# Patient Record
Sex: Female | Born: 1970
Health system: Southern US, Community
[De-identification: ages and names within clinical notes are randomized; demographics above are authoritative.]

## PROBLEM LIST (undated history)

## (undated) DIAGNOSIS — Z87442 Personal history of urinary calculi: Secondary | ICD-10-CM

## (undated) DIAGNOSIS — M545 Low back pain, unspecified: Secondary | ICD-10-CM

## (undated) DIAGNOSIS — J329 Chronic sinusitis, unspecified: Secondary | ICD-10-CM

## (undated) DIAGNOSIS — A64 Unspecified sexually transmitted disease: Secondary | ICD-10-CM

## (undated) DIAGNOSIS — G8929 Other chronic pain: Secondary | ICD-10-CM

## (undated) HISTORY — DX: Unspecified sexually transmitted disease: A64

## (undated) HISTORY — DX: Low back pain, unspecified: M54.50

## (undated) HISTORY — DX: Chronic sinusitis, unspecified: J32.9

## (undated) HISTORY — DX: Low back pain, unspecified: G89.29

## (undated) HISTORY — DX: Personal history of urinary calculi: Z87.442

## (undated) HISTORY — DX: Low back pain: M54.5

---

## 2003-08-04 DIAGNOSIS — M5137 Other intervertebral disc degeneration, lumbosacral region: Secondary | ICD-10-CM | POA: Insufficient documentation

## 2003-08-04 DIAGNOSIS — M51379 Other intervertebral disc degeneration, lumbosacral region without mention of lumbar back pain or lower extremity pain: Secondary | ICD-10-CM | POA: Insufficient documentation

## 2014-12-07 ENCOUNTER — Ambulatory Visit (INDEPENDENT_AMBULATORY_CARE_PROVIDER_SITE_OTHER): Payer: 59 | Admitting: Obstetrics and Gynecology

## 2014-12-07 ENCOUNTER — Encounter: Payer: Self-pay | Admitting: Obstetrics and Gynecology

## 2014-12-07 VITALS — BP 112/68 | HR 64 | Resp 14 | Ht 65.5 in | Wt 162.0 lb

## 2014-12-07 DIAGNOSIS — Z113 Encounter for screening for infections with a predominantly sexual mode of transmission: Secondary | ICD-10-CM

## 2014-12-07 DIAGNOSIS — Z23 Encounter for immunization: Secondary | ICD-10-CM

## 2014-12-07 DIAGNOSIS — B3731 Acute candidiasis of vulva and vagina: Secondary | ICD-10-CM

## 2014-12-07 DIAGNOSIS — Z0000 Encounter for general adult medical examination without abnormal findings: Secondary | ICD-10-CM | POA: Diagnosis not present

## 2014-12-07 DIAGNOSIS — Z01419 Encounter for gynecological examination (general) (routine) without abnormal findings: Secondary | ICD-10-CM | POA: Diagnosis not present

## 2014-12-07 DIAGNOSIS — Z124 Encounter for screening for malignant neoplasm of cervix: Secondary | ICD-10-CM

## 2014-12-07 DIAGNOSIS — B373 Candidiasis of vulva and vagina: Secondary | ICD-10-CM | POA: Diagnosis not present

## 2014-12-07 LAB — POCT URINE PREGNANCY: PREG TEST UR: NEGATIVE

## 2014-12-07 MED ORDER — FLUCONAZOLE 150 MG PO TABS: 150.0000 mg | ORAL_TABLET | Freq: Once | ORAL | Status: DC

## 2014-12-07 MED ORDER — VALACYCLOVIR HCL 500 MG PO TABS: ORAL_TABLET | ORAL | Status: DC

## 2014-12-07 MED ORDER — MISOPROSTOL 200 MCG PO TABS: ORAL_TABLET | ORAL | Status: DC

## 2014-12-07 NOTE — Progress Notes (Signed)
Patient ID: Lauren Randolph, female   DOB: 08/07/1970, 44 y.o.   MRN: [id number] 44 y.o. G2P0020 SingleCaucasianF here for annual exam.  Normal cycles, tolerable cramps. Sexually active using w/d for contraception. New partner x 7-8 weeks, no dyspareunia. She has a h/o breast cysts. She can palpate a cyst in her right breast, no change, just gets tender with her cycle.  H/O hsv, rare outbreaks every 1.5 years. She had normal labs with her primary MD. She will do annual testing with work.  Period Cycle (Days): 26 Period Duration (Days): 5 Period Pattern: Regular Menstrual Flow:  (heavy x2 days then light) Menstrual Control: Tampon (recently using soft cups) Menstrual Control Change Freq (Hours): on heaviest day changes soft cup every 9 hrs Dysmenorrhea: (!) Moderate (cramps on first 1-2 days) Dysmenorrhea Symptoms: Cramping  Patient's last menstrual period was 11/18/2014.          Sexually active: Yes.    The current method of family planning is withdrawal.    Exercising: Yes.    orange theorgy classes, Barre,yoga and walking. Smoker:  no  Health Maintenance: Pap:  11-23-13  Normal per patient--unsure if had HPV testing. History of abnormal Pap:  no MMG:  11-24-13 [place] in [place].  Has had ultrasounds due to benign cysts on breasts. Colonoscopy:  n/a BMD:   n/a TDaP:  Unsure, greater than 10 years? Gardasil: n/a   reports that she has never smoked. She has never used smokeless tobacco. She reports that she drinks about 0.6 oz of alcohol per week. She reports that she does not use illicit drugs.She  moved here at the end of last year. She works from home, family is 1 hour away.   Past Medical History  Diagnosis Date  . History of kidney stones     at age 89  . STD (sexually transmitted disease)     hx of HSVII since age 44    History reviewed. No pertinent past surgical history.  No current outpatient prescriptions on file.   No current facility-administered medications  for this visit.    Family History  Problem Relation Age of Onset  . Heart disease Father   . Other Father     Dec MVA due to poss heart attack at age 44  . Diabetes Father     AODM  . Hypertension Father   . Heart disease Sister   . Heart attack Sister [id number]  . Hypertension Mother   . Stroke Maternal Grandmother   Sister was overweight, terrible diet and smoker  Review of Systems  Constitutional: Negative.   HENT: Negative.   Eyes: Negative.   Respiratory: Negative.   Cardiovascular: Negative.   Gastrointestinal: Negative.   Endocrine: Negative.   Genitourinary: Negative.   Musculoskeletal: Negative.   Skin: Negative.   Allergic/Immunologic: Negative.   Neurological: Negative.   Psychiatric/Behavioral: Negative.     Exam:   BP 112/68 mmHg  Pulse 64  Resp 14  Ht 5' 5.5" (1.664 m)  Wt 162 lb (73.483 kg)  BMI 26.54 kg/m2  LMP 11/18/2014  Weight change: @WEIGHTCHANGE @ Height:   Height: 5' 5.5" (166.4 cm)  Ht Readings from Last 3 Encounters:  12/07/14 5' 5.5" (1.664 m)    General appearance: alert, cooperative and appears stated age Head: Normocephalic, without obvious abnormality, atraumatic Neck: no adenopathy, supple, symmetrical, trachea midline and thyroid normal to inspection and palpation Lungs: clear to auscultation bilaterally Breasts: 2 cm cystic lump palpated in the upper outer right breast,  per patient confirmed cyst, no change. No other lumps, no dimpling.  Heart: regular rate and rhythm Abdomen: soft, non-tender; bowel sounds normal; no masses,  no organomegaly Extremities: extremities normal, atraumatic, no cyanosis or edema Skin: Skin color, texture, turgor normal. No rashes or lesions Lymph nodes: Cervical, supraclavicular, and axillary nodes normal. No abnormal inguinal nodes palpated Neurologic: Grossly normal   Pelvic: External genitalia:  no lesions              Urethra:  normal appearing urethra with no masses, tenderness or lesions               Bartholins and Skenes: normal                 Vagina: normal appearing vagina with normal color. Thick clumpy white d/c noted, cw yeast (on questioning she c/o mild itching this morning)              Cervix: no lesions               Bimanual Exam:  Uterus:  normal size, contour, position, consistency, mobility, non-tender              Adnexa: no mass, fullness, tenderness               Rectovaginal: Confirms               Anus:  normal sphincter tone, no lesions  Chaperone was present for exam.  Wet prep: no clue, no trich KOH: +yeast PH: 4  A:  Well Woman with normal exam   Contraception  HSV, rare outbreaks, has a new partner  Yeast vaginitis  P:   Will send in a script for valtrex for prn use, she will call if she wants to start suppression   Pap with hpv testing  Will return for paragaurd IUD when she is on her cycle  Will pretreat with cytotec  D[place]

## 2014-12-08 LAB — HEPATITIS C ANTIBODY: HCV AB: NEGATIVE

## 2014-12-08 LAB — STD PANEL
HEP B S AG: NEGATIVE
HIV 1&2 Ab, 4th Generation: NONREACTIVE

## 2014-12-11 ENCOUNTER — Other Ambulatory Visit: Payer: Self-pay

## 2014-12-11 DIAGNOSIS — Z1231 Encounter for screening mammogram for malignant neoplasm of breast: Secondary | ICD-10-CM

## 2014-12-11 LAB — IPS PAP TEST WITH HPV

## 2014-12-11 LAB — IPS N GONORRHOEA AND CHLAMYDIA BY PCR

## 2014-12-15 ENCOUNTER — Telehealth: Payer: Self-pay | Admitting: Obstetrics and Gynecology

## 2014-12-15 DIAGNOSIS — Z30430 Encounter for insertion of intrauterine contraceptive device: Secondary | ICD-10-CM

## 2014-12-15 NOTE — Telephone Encounter (Signed)
Patient started cycle today and would like to schedule iud insertion for Monday possibly.

## 2014-12-15 NOTE — Telephone Encounter (Signed)
Contacted patient, she started her cycle today.  Scheduled paragard IUD insertion for 12/18/14 at 1430. Pre-procedure instructions given. Motrin 800 mg PO one hour before appointment with food and fluids. Verbalizes understanding instructions regarding use of Cytotec.  Order placed, patient has checked her own insurance for coverage and is ready to proceed.  Routing to provider for final review. Patient agreeable to disposition. Will close encounter.  cc Lilyan GilfordBecky Frahm for insurance pre-certification and patient contact.

## 2014-12-18 ENCOUNTER — Ambulatory Visit (INDEPENDENT_AMBULATORY_CARE_PROVIDER_SITE_OTHER): Payer: 59 | Admitting: Obstetrics and Gynecology

## 2014-12-18 ENCOUNTER — Encounter: Payer: Self-pay | Admitting: Obstetrics and Gynecology

## 2014-12-18 VITALS — BP 100/68 | HR 58 | Resp 14 | Wt 166.0 lb

## 2014-12-18 DIAGNOSIS — Z30430 Encounter for insertion of intrauterine contraceptive device: Secondary | ICD-10-CM | POA: Diagnosis not present

## 2014-12-18 DIAGNOSIS — Z01812 Encounter for preprocedural laboratory examination: Secondary | ICD-10-CM | POA: Diagnosis not present

## 2014-12-18 LAB — POCT URINE PREGNANCY: Preg Test, Ur: NEGATIVE

## 2014-12-18 NOTE — Progress Notes (Signed)
Patient ID: Lauren Randolph, female   DOB: 25-Jun-1970, 44 y.o.   MRN: [id number] GYNECOLOGY  VISIT   HPI: 44 y.o.   Single  Caucasian  female   G2P0020 with Patient's last menstrual period was 12/15/2014.   here for  Paragard IUD insertion. Recent negative STD testing  GYNECOLOGIC HISTORY: Patient's last menstrual period was 12/15/2014. Contraception:None Menopausal hormone therapy: N/A        OB History    Gravida Para Term Preterm AB TAB SAB Ectopic Multiple Living           There are no active problems to display for this patient.   Past Medical History  Diagnosis Date  . History of kidney stones     at age 23  . STD (sexually transmitted disease)     hx of HSVII since age 64    History reviewed. No pertinent past surgical history.  Current Outpatient Prescriptions  Medication Sig Dispense Refill  . misoprostol (CYTOTEC) 200 MCG tablet Place 2 tablets in the vagina 6-12 hours prior to IUD insertion 1 tablet 0  . valACYclovir (VALTREX) 500 MG tablet 1 tab po bid x 3 days prn 30 tablet 1   No current facility-administered medications for this visit.     ALLERGIES: Review of patient's allergies indicates no known allergies.  Family History  Problem Relation Age of Onset  . Heart disease Father   . Other Father     Dec MVA due to poss heart attack at age 18  . Diabetes Father     AODM  . Hypertension Father   . Heart disease Sister   . Heart attack Sister 21  . Hypertension Mother   . Stroke Maternal Grandmother     Social History   Social History  . Marital Status: Single    Spouse Name: N/A  . Number of Children: N/A  . Years of Education: N/A   Occupational History  . Not on file.   Social History Main Topics  . Smoking status: Never Smoker   . Smokeless tobacco: Never Used  . Alcohol Use: 0.6 oz/week    1 Standard drinks or equivalent per week  . Drug Use: No  . Sexual Activity: Yes    Birth Control/ Protection: Other-see  comments     Comment: withdrawal   Other Topics Concern  . Not on file   Social History Narrative    Review of Systems  All other systems reviewed and are negative.   PHYSICAL EXAMINATION:    BP 100/68 mmHg  Pulse 58  Resp 14  Wt 166 lb (75.297 kg)  LMP 12/15/2014    General appearance: alert, cooperative and appears stated age  Pelvic: External genitalia:  no lesions              Urethra:  normal appearing urethra with no masses, tenderness or lesions              Bartholins and Skenes: normal                 Vagina: normal appearing vagina with normal color and discharge, no lesions              Cervix: no lesions              UPT negative  The risks of the Paragard IUD were reviewed with the patient, including infection, abnormal bleeding and uterine perfortion. Consent was signed.  A speculum was placed in the vagina, the cervix was cleansed with betadine. A tenaculum was placed on the cervix, the uterus sounded to 7-8cm. The cervix was dilated to a #5 hagar dilator  The Paragard IUD was inserted without difficulty. The string were cut to 4 cm. The tenaculum was removed. Slight oozing from the tenaculum site was stopped with pressure.   The patient tolerated the procedure well.     Chaperone was present for exam.  ASSESSMENT Paragard IUD insertion    PLAN F/U in 1 month   An After Visit Summary was printed and given to the patient.

## 2014-12-18 NOTE — Patient Instructions (Signed)

## 2014-12-27 ENCOUNTER — Ambulatory Visit
Admission: RE | Admit: 2014-12-27 | Discharge: 2014-12-27 | Disposition: A | Discharge status: Home / Self Care | Payer: 59 | Source: Ambulatory Visit

## 2014-12-27 DIAGNOSIS — Z1231 Encounter for screening mammogram for malignant neoplasm of breast: Secondary | ICD-10-CM

## 2015-01-18 ENCOUNTER — Ambulatory Visit (INDEPENDENT_AMBULATORY_CARE_PROVIDER_SITE_OTHER): Payer: 59 | Admitting: Obstetrics and Gynecology

## 2015-01-18 ENCOUNTER — Encounter: Payer: Self-pay | Admitting: Obstetrics and Gynecology

## 2015-01-18 ENCOUNTER — Ambulatory Visit: Payer: 59 | Admitting: Obstetrics and Gynecology

## 2015-01-18 VITALS — BP 108/68 | HR 70 | Resp 18 | Ht 65.5 in | Wt 168.0 lb

## 2015-01-18 DIAGNOSIS — Z30431 Encounter for routine checking of intrauterine contraceptive device: Secondary | ICD-10-CM

## 2015-01-18 NOTE — Progress Notes (Signed)
GYNECOLOGY  VISIT   HPI: 44 y.o.   Single  Caucasian  female   G2P0020 with Patient's last menstrual period was 01/15/2015 (exact date).   here for   IUD check. She had a ParaGard IUD placed last month. She is on her cycle now, started with intermittent spotting, waves of cramping. On Monday the flow started, no significant pain with the blood flow. Bleeding is slightly heavier, not bad. She has saturated a regular tampon in 3 hours. Sexually active since insertion, slightly uncomfortable initially, now fine.   GYNECOLOGIC HISTORY: Patient's last menstrual period was 01/15/2015 (exact date). Contraception:IUD Menopausal hormone therapy: None        OB History    Gravida Para Term Preterm AB TAB SAB Ectopic Multiple Living   2 0 0 0 2 2 0 0 0 0          There are no active problems to display for this patient.   Past Medical History  Diagnosis Date  . History of kidney stones     at age 89  . STD (sexually transmitted disease)     hx of HSVII since age 44  . Sinusitis     History reviewed. No pertinent past surgical history.  Current Outpatient Prescriptions  Medication Sig Dispense Refill  . fluconazole (DIFLUCAN) 150 MG tablet Take one tablet now.  If symptoms have not improved in 3 days take additional tablet    . fluticasone (FLONASE) 50 MCG/ACT nasal spray Place 1 spray into the nose.    . predniSONE (DELTASONE) 20 MG tablet Take 40 mg by mouth.    . valACYclovir (VALTREX) 500 MG tablet 1 tab po bid x 3 days prn 30 tablet 1  . misoprostol (CYTOTEC) 200 MCG tablet Place 2 tablets in the vagina 6-12 hours prior to IUD insertion (Patient not taking: Reported on 01/18/2015) 1 tablet 0   No current facility-administered medications for this visit.     ALLERGIES: Review of patient's allergies indicates no known allergies.  Family History  Problem Relation Age of Onset  . Heart disease Father   . Other Father     Dec MVA due to poss heart attack at age 44  . Diabetes  Father     AODM  . Hypertension Father   . Heart disease Sister   . Heart attack Sister [id number]  . Hypertension Mother   . Stroke Maternal Grandmother     Social History   Social History  . Marital Status: Single    Spouse Name: N/A  . Number of Children: N/A  . Years of Education: N/A   Occupational History  . Not on file.   Social History Main Topics  . Smoking status: Never Smoker   . Smokeless tobacco: Never Used  . Alcohol Use: 0.6 oz/week    1 Standard drinks or equivalent per week  . Drug Use: No  . Sexual Activity: Yes    Birth Control/ Protection: Other-see comments     Comment: withdrawal   Other Topics Concern  . Not on file   Social History Narrative    ROS  PHYSICAL EXAMINATION:    BP 108/68 mmHg  Pulse 70  Resp 18  Ht 5' 5.5" (1.664 m)  Wt 168 lb (76.204 kg)  BMI 27.52 kg/m2  LMP 01/15/2015 (Exact Date)    General appearance: alert, cooperative and appears stated age  Pelvic: External genitalia:  no lesions  Urethra:  normal appearing urethra with no masses, tenderness or lesions              Bartholins and Skenes: normal                 Vagina: normal appearing vagina with normal color and discharge, no lesions              Cervix: no lesions and IUD string 3-4 cm              Bimanual Exam:  Uterus:  normal size, contour, position, consistency, mobility, non-tender              Adnexa: no mass, fullness, tenderness                 ASSESSMENT IUD check, overall doing well with the Paragard, some increased premenstrual cramping    PLAN She will call if the cramping doesn't continue to improve   An After Visit Summary was printed and given to the patient.

## 2015-11-30 ENCOUNTER — Telehealth: Payer: Self-pay | Admitting: *Deleted

## 2015-11-30 ENCOUNTER — Encounter: Payer: Self-pay | Admitting: *Deleted

## 2015-11-30 NOTE — Telephone Encounter (Signed)
Unable to reach patient at time of Pre-Visit Call.  Left message for patient to return call when available.    

## 2015-11-30 NOTE — Telephone Encounter (Signed)
Pre-Visit Call completed with patient and chart updated.   Pre-Visit Info documented in Specialty Comments under SnapShot.    

## 2015-12-03 ENCOUNTER — Ambulatory Visit (INDEPENDENT_AMBULATORY_CARE_PROVIDER_SITE_OTHER): Payer: 59 | Admitting: Family Medicine

## 2015-12-03 ENCOUNTER — Encounter: Payer: Self-pay | Admitting: Family Medicine

## 2015-12-03 VITALS — BP 118/79 | HR 69 | Temp 99.0°F | Ht 65.5 in | Wt 175.0 lb

## 2015-12-03 DIAGNOSIS — B351 Tinea unguium: Secondary | ICD-10-CM | POA: Diagnosis not present

## 2015-12-03 DIAGNOSIS — Z13220 Encounter for screening for lipoid disorders: Secondary | ICD-10-CM

## 2015-12-03 DIAGNOSIS — Z131 Encounter for screening for diabetes mellitus: Secondary | ICD-10-CM | POA: Diagnosis not present

## 2015-12-03 DIAGNOSIS — Z130 Encounter for screening for diseases of the blood and blood-forming organs and certain disorders involving the immune mechanism: Secondary | ICD-10-CM | POA: Diagnosis not present

## 2015-12-03 DIAGNOSIS — Z23 Encounter for immunization: Secondary | ICD-10-CM | POA: Diagnosis not present

## 2015-12-03 DIAGNOSIS — Z1329 Encounter for screening for other suspected endocrine disorder: Secondary | ICD-10-CM | POA: Diagnosis not present

## 2015-12-03 DIAGNOSIS — R911 Solitary pulmonary nodule: Secondary | ICD-10-CM

## 2015-12-03 LAB — COMPREHENSIVE METABOLIC PANEL
ALT: 32 U/L (ref 0–35)
AST: 30 U/L (ref 0–37)
Albumin: 4.5 g/dL (ref 3.5–5.2)
Alkaline Phosphatase: 81 U/L (ref 39–117)
BILIRUBIN TOTAL: 1.1 mg/dL (ref 0.2–1.2)
BUN: 13 mg/dL (ref 6–23)
CALCIUM: 9.8 mg/dL (ref 8.4–10.5)
CHLORIDE: 103 meq/L (ref 96–112)
CO2: 31 meq/L (ref 19–32)
CREATININE: 0.84 mg/dL (ref 0.40–1.20)
GFR: 77.94 mL/min (ref >60.00)
Glucose, Bld: 94 mg/dL (ref 70–99)
Potassium: 4.4 mEq/L (ref 3.5–5.1)
SODIUM: 139 meq/L (ref 135–145)
Total Protein: 7.3 g/dL (ref 6.0–8.3)

## 2015-12-03 LAB — TSH: TSH: 1.51 u[IU]/mL (ref 0.35–4.50)

## 2015-12-03 LAB — LIPID PANEL
CHOLESTEROL: 171 mg/dL (ref 0–200)
HDL: 67.7 mg/dL (ref >39.00)
LDL Cholesterol: 86 mg/dL (ref 0–99)
NonHDL: 103.29
TRIGLYCERIDES: 86 mg/dL (ref 0.0–149.0)
Total CHOL/HDL Ratio: 3
VLDL: 17.2 mg/dL (ref 0.0–40.0)

## 2015-12-03 LAB — CBC
HCT: 40.2 % (ref 36.0–46.0)
Hemoglobin: 13.9 g/dL (ref 12.0–15.0)
MCHC: 34.5 g/dL (ref 30.0–36.0)
MCV: 91.8 fl (ref 78.0–100.0)
PLATELETS: 185 10*3/uL (ref 150.0–400.0)
RBC: 4.37 Mil/uL (ref 3.87–5.11)
RDW: 12.3 % (ref 11.5–15.5)
WBC: 5.7 10*3/uL (ref 4.0–10.5)

## 2015-12-03 LAB — HEMOGLOBIN A1C: HEMOGLOBIN A1C: 4.9 % (ref 4.6–6.5)

## 2015-12-03 MED ORDER — TERBINAFINE HCL 250 MG PO TABS: 250.0000 mg | ORAL_TABLET | Freq: Every day | ORAL | 2 refills | Status: DC

## 2015-12-03 MED FILL — TERBINAFINE HCL 250 MG TAB: 250 | 30 days supply | Qty: 30 | Fill #0

## 2015-12-03 NOTE — Patient Instructions (Signed)
It was very nice to see you today- take care and please let me know if you do not receive word about your CT chest soon We will check you labs today, and will start you on terbinafine (lamisil) 250 daily for 12 weeks for your toenails I will be in touch with your labs and chest x-ray report

## 2015-12-03 NOTE — Progress Notes (Signed)
[facility] [address], [address] [place], [place] [address] [phone] [phone]  Date:  12/03/2015   Name:  Lauren Randolph   DOB:  10/16/1970   MRN:  [id number]  PCP:  Abbe AmsterdamOPLAND,Lynnea Vandervoort, MD    Chief Complaint: Establish Care   History of Present Illness:  Lauren Randolph is a 45 y.o. very pleasant female patient who presents with the following:  Here today as a new patient- [place] She would like her flu shot today She has never been a smoker, light alcohol use.   She has a paraguard IUD in place, no concern about pregnancy.  IUD placed last year  She is originally from [place], moved here a couple of years ago to be closer to her family.  She works with [employer], she is able to work from home.    She did have renal stones as a small child- she has not had issues with this since.  She had calcium stones; they are unsure why she developed these.   She is married, no children. She is recently married- this past september In her free time she likes to exercise- she recently joined a new gym.  She enjoys doing classes and especially yoga.  She does tend to have low back pain but exercise helps her; she does have degenerative disc disease at L4/5 She has gained a little bit of weight which has exacerbated her back pain. She plans to work on losing this once she gets settled- she and her husband have recently moved into a new home  She did some decompression therapy through a non- surgical spine center that did help her some.   Her pap, tdap are UTD.   She has noted a little nasal congestion over the last few days but an OTC allergy med- claritin- helps her so she thinks she likely has mild allergies  She is fasting today except for coffee.   There are no active problems to display for this patient.   Past Medical History:  Diagnosis Date  . Chronic low back pain   . History of kidney stones    at age 89  . Sinusitis   . STD  (sexually transmitted disease)    hx of HSVII since age 45    History reviewed. No pertinent surgical history.  Social History  Substance Use Topics  . Smoking status: Never Smoker  . Smokeless tobacco: Never Used  . Alcohol use 1.2 oz/week    1 Standard drinks or equivalent, 1 Glasses of wine per week    Family History  Problem Relation Age of Onset  . Heart disease Father   . Other Father     Dec MVA due to poss heart attack at age 45  . Diabetes Father     AODM  . Hypertension Father   . Heart attack Sister [id number]  . Hypertension Mother   . Heart disease Sister   . Stroke Maternal Grandmother     No Known Allergies  Medication list has been reviewed and updated.  Current Outpatient Prescriptions on File Prior to Visit  Medication Sig Dispense Refill  . loratadine (CLARITIN) 10 MG tablet Take 10 mg by mouth daily as needed for allergies.    . valACYclovir (VALTREX) 500 MG tablet 1 tab po bid x 3 days prn 30 tablet 1   No current facility-administered medications on file prior to visit.     Review of  Systems:  As per HPI- otherwise negative. She has been exposed to 2nd hand smoke- as a child her parents smoked in the home Her father had an MI at age 45, and her sister was 45 yo when she had her MI- however they were all heavy smokers.  She herself does not have any exertional CP She did have "heart savers" testing done in 2015; unfortunately she had screening full body CT including her head, chest, abd and pelvis!  This was all normal except for a small lung nodule- they recommended a follow-up scan which has not been done yet.   No fever or chills She has noted some thickening of her toenails, esp the left great toe following some trauma a few weeks ago   Physical Examination: Vitals:   12/03/15 0856  BP: 118/79  Pulse: 69  Temp: 99 F (37.2 C)   Vitals:   12/03/15 0856  Weight: 175 lb (79.4 kg)  Height: 5' 5.5" (1.664 m)   Body mass index is 28.68  kg/m. Ideal Body Weight: Weight in (lb) to have BMI = 25: 152.2  GEN: WDWN, NAD, Non-toxic, A & O x 3, mild overweight, looks well HEENT: Atraumatic, Normocephalic. Neck supple. No masses, No LAD. Bilateral TM wnl, oropharynx normal.  PEERL,EOMI.   Ears and Nose: No external deformity. CV: RRR, No M/G/R. No JVD. No thrill. No extra heart sounds. PULM: CTA B, no wheezes, crackles, rhonchi. No retractions. No resp. distress. No accessory muscle use. ABD: S, NT, ND. No rebound. No HSM. EXTR: No c/c/e NEURO Normal gait.  PSYCH: Normally interactive. Conversant. Not depressed or anxious appearing.  Calm demeanor.  The great, 2nd and pinky toes on the left show thickening and some lifting of the nail likely due to a fungal infection   Assessment and Plan: Onychomycosis - Plan: CBC, Comprehensive metabolic panel, terbinafine (LAMISIL) 250 MG tablet  Screening for hyperlipidemia - Plan: Lipid panel  Screening for deficiency anemia - Plan: CBC  Screening for diabetes mellitus - Plan: Hemoglobin A1C  Screening for thyroid disorder - Plan: TSH  Lung nodule - Plan: CT Chest Wo Contrast  Her today as a new patinet  Screening labs as above Discussed her "screening CT and coronary calcium testing."  She now understands that such testing is not recommended and that it may expose her to harmful radiation- she does not plan to do this sort of screening again. unfortunately they did see a lung nodule and recommended follow-up.  Will do a follow-up CT for her, as she is now 2 years out we hope this will be her last screening.  The report is not clear about the exact location of the nodule but she does have the original study on a disc which she will bring to her CT appt Discussed her toenails- likely onychomycosis.  She declines culture but would like to go ahead and treat for 12 weeks which is reasonable.  Will get baseline labs for her today to ensure lamisil is ok for her to use Will plan further  follow- up pending labs.   Signed Abbe AmsterdamJessica Jyaire Koudelka, MD

## 2015-12-03 NOTE — Progress Notes (Signed)
Pre visit review using our clinic review tool, if applicable. No additional management support is needed unless otherwise documented below in the visit note. 

## 2015-12-04 ENCOUNTER — Ambulatory Visit (HOSPITAL_BASED_OUTPATIENT_CLINIC_OR_DEPARTMENT_OTHER)
Admission: RE | Admit: 2015-12-04 | Discharge: 2015-12-04 | Disposition: A | Discharge status: Home / Self Care | Payer: 59 | Source: Ambulatory Visit | Attending: Family Medicine | Admitting: Family Medicine

## 2015-12-04 ENCOUNTER — Encounter: Payer: Self-pay | Admitting: Family Medicine

## 2015-12-04 DIAGNOSIS — R918 Other nonspecific abnormal finding of lung field: Secondary | ICD-10-CM | POA: Diagnosis not present

## 2015-12-04 DIAGNOSIS — R911 Solitary pulmonary nodule: Secondary | ICD-10-CM

## 2015-12-17 ENCOUNTER — Ambulatory Visit (INDEPENDENT_AMBULATORY_CARE_PROVIDER_SITE_OTHER): Payer: 59 | Admitting: Obstetrics and Gynecology

## 2015-12-17 ENCOUNTER — Encounter: Payer: Self-pay | Admitting: Obstetrics and Gynecology

## 2015-12-17 VITALS — BP 122/70 | HR 64 | Resp 15 | Ht 65.5 in | Wt 176.0 lb

## 2015-12-17 DIAGNOSIS — Z01419 Encounter for gynecological examination (general) (routine) without abnormal findings: Secondary | ICD-10-CM

## 2015-12-17 DIAGNOSIS — Z30431 Encounter for routine checking of intrauterine contraceptive device: Secondary | ICD-10-CM

## 2015-12-17 DIAGNOSIS — N898 Other specified noninflammatory disorders of vagina: Secondary | ICD-10-CM | POA: Diagnosis not present

## 2015-12-17 NOTE — Progress Notes (Signed)
45 y.o. [id number] MarriedCaucasianF here for annual exam. Just got married in 9/17.  Paragard IUD placed 11/16.  Period Cycle (Days): 26 Period Duration (Days): 5-8 days  Period Pattern: Regular Menstrual Flow: Moderate, Light Menstrual Control: Other (Comment) Dysmenorrhea: (!) Mild Dysmenorrhea Symptoms: Cramping  She changes her diva cup every 8-10 hours. Sexually active, no pain. Her last cycle was 2 weeks late, otherwise always on time. Some spotting the week prior. Mostly bleeds for 5 days, sometimes 8 days since she got the IUD last year.  Occasional night sweats.  No HSV outbreaks in the last year On questioning she has noticed an increased vaginal d/c in the last day.   Patient's last menstrual period was 12/12/2015.          Sexually active: Yes.    The current method of family planning is IUD.    Exercising: Yes.    cardio/ yoga/  zumba  Smoker:  no  Health Maintenance: Pap:  12-07-14 WNL NEG HR HPV History of abnormal Pap:  no MMG:  01-01-15 WNL  Colonoscopy:  Never BMD:  06-03-13 WNL  TDaP:  12-07-14  Gardasil: N/A   reports that she has never smoked. She has never used smokeless tobacco. She reports that she drinks about 1.2 oz of alcohol per week . She reports that she does not use drugs.She is working from home in [employer]. Likes working from home.   Past Medical History:  Diagnosis Date  . Chronic low back pain   . History of kidney stones    at age 89  . Sinusitis   . STD (sexually transmitted disease)    hx of HSVII since age 45    No past surgical history on file.  Current Outpatient Prescriptions  Medication Sig Dispense Refill  . loratadine (CLARITIN) 10 MG tablet Take 10 mg by mouth daily as needed for allergies.    Marland Kitchen. terbinafine (LAMISIL) 250 MG tablet Take 1 tablet (250 mg total) by mouth daily. Take for 12 weeks for toenail fungus 30 tablet 2  . valACYclovir (VALTREX) 500 MG tablet 1 tab po bid x 3 days prn 30 tablet 1   No current  facility-administered medications for this visit.     Family History  Problem Relation Age of Onset  . Heart disease Father   . Other Father     Dec MVA due to poss heart attack at age 45  . Diabetes Father     AODM  . Hypertension Father   . Heart attack Sister [id number]  . Hypertension Mother   . Heart disease Sister   . Stroke Maternal Grandmother     Review of Systems  Constitutional: Negative.   HENT: Negative.   Eyes: Negative.   Respiratory: Negative.   Cardiovascular: Negative.   Gastrointestinal: Negative.   Endocrine: Negative.   Genitourinary: Negative.   Musculoskeletal: Negative.   Skin: Negative.   Allergic/Immunologic: Negative.   Neurological: Negative.   Psychiatric/Behavioral: Negative.     Exam:   BP 122/70 (BP Location: Right Arm, Patient Position: Sitting, Cuff Size: Normal)   Pulse 64   Resp 15   Ht 5' 5.5" (1.664 m)   Wt 176 lb (79.8 kg)   LMP 12/12/2015   BMI 28.84 kg/m   Weight change: @WEIGHTCHANGE @ Height:   Height: 5' 5.5" (166.4 cm)  Ht Readings from Last 3 Encounters:  12/17/15 5' 5.5" (1.664 m)  12/03/15 5' 5.5" (1.664 m)  01/18/15 5' 5.5" (1.664 m)  General appearance: alert, cooperative and appears stated age Head: Normocephalic, without obvious abnormality, atraumatic Neck: no adenopathy, supple, symmetrical, trachea midline and thyroid normal to inspection and palpation Lungs: clear to auscultation bilaterally Breasts: normal appearance, no masses or tenderness Heart: regular rate and rhythm Abdomen: soft, non-tender; bowel sounds normal; no masses,  no organomegaly Extremities: extremities normal, atraumatic, no cyanosis or edema Skin: Skin color, texture, turgor normal. No rashes or lesions Lymph nodes: Cervical, supraclavicular, and axillary nodes normal. No abnormal inguinal nodes palpated Neurologic: Grossly normal   Pelvic: External genitalia:  no lesions              Urethra:  normal appearing urethra with no masses,  tenderness or lesions              Bartholins and Skenes: normal                 Vagina: normal appearing vagina with an increase in both watery and thick white vaginal discharge              Cervix: no lesions, IUD string 2 cm               Bimanual Exam:  Uterus:  normal size, contour, position, consistency, mobility, non-tender              Adnexa: no mass, fullness, tenderness               Rectovaginal: Confirms               Anus:  normal sphincter tone, no lesions  Chaperone was present for exam.  A:  Well Woman with normal exam  IUD check  Vaginal discharge  Breast with fibrocystic changes, no change per patient or prior exam here. She has had prior negative diagnostic imaging.  P:   No Pap this year  Mammogram, she will get a 3D mammogram  Discussed breast self exam  Discussed calcium and vit D intake  Labs with primary  Wet prep probe

## 2015-12-17 NOTE — Patient Instructions (Signed)

## 2015-12-18 ENCOUNTER — Telehealth: Payer: Self-pay | Admitting: *Deleted

## 2015-12-18 LAB — WET PREP BY MOLECULAR PROBE
Candida species: POSITIVE — AB
GARDNERELLA VAGINALIS: POSITIVE — AB
TRICHOMONAS VAG: NEGATIVE

## 2015-12-18 MED ORDER — METRONIDAZOLE 500 MG PO TABS: 500.0000 mg | ORAL_TABLET | Freq: Two times a day (BID) | ORAL | 0 refills | Status: AC

## 2015-12-18 MED ORDER — FLUCONAZOLE 150 MG PO TABS: 150.0000 mg | ORAL_TABLET | Freq: Once | ORAL | 0 refills | Status: AC

## 2015-12-18 MED FILL — FLUCONAZOLE 150 MG TABLET: 150 | 3 days supply | Qty: 2 | Fill #0

## 2015-12-18 MED FILL — metroNIDAZOLE 500 MG TABS: 500 | 7 days supply | Qty: 14 | Fill #0

## 2015-12-18 NOTE — Telephone Encounter (Signed)
-----   Message from Romualdo BolkJill Evelyn Jertson, MD sent at 12/18/2015 10:00 AM EST ----- Please inform the patient that her vaginitis probe was + for BV and treat with flagyl (either oral or vaginal, her choice), no ETOH while on Flagyl.  Oral: Flagyl 500 mg BID x 7 days, or Vaginal: Metrogel, 1 applicator per vagina q day x 5 days. Also + for yeast, treat with diflucan 150 mg x 1, may repeat in 72 hours if still symptomatic. #2, no refills

## 2015-12-18 NOTE — Telephone Encounter (Signed)
Spoke with patient and went over results in detail. Sent in both RX. Advised patient to avoid alcohol while taking the Flagyl. Patient voiced understanding -eh

## 2015-12-19 ENCOUNTER — Encounter: Payer: Self-pay | Admitting: Obstetrics and Gynecology

## 2015-12-20 ENCOUNTER — Telehealth: Payer: Self-pay

## 2015-12-20 NOTE — Telephone Encounter (Signed)
Telephone encounter created to review with Dr.Jertson. 

## 2015-12-20 NOTE — Telephone Encounter (Signed)
Please let the patient know that I don't think the Lamisil is linked to the BV and I don't see any contraindication to taking the Lamisil with the flagyl or diflucan.

## 2015-12-20 NOTE — Telephone Encounter (Signed)
Routing to Dr.Jertson for review and advise.  Non-Urgent Medical Question  Message [phone]  From Lauren Randolph To Romualdo BolkJill Evelyn Jertson, MD Sent 12/19/2015 9:01 AM  Hi Dr Shela CommonsJ.  I picked up the antibiotic and diflucan this morning and asked the pharmacist if it was ok to take these along with the lamisil that I an currently on. He said he thought it was fine but recommended I check with you first. I also was wondering if my being on lamisil for about 15 days would have caused the BV or if this is linked at all. I was just thinking about how every time I take antibiotics a yeast infection happens. Maybe a similar kind of thing happens with anti fungal meds. Your thoughts?   Thanks,  Lauren Randolph   Responsible Party   Pool - [facility] No one has taken responsibility for this message.  No actions have been taken on this message.

## 2015-12-21 NOTE — Telephone Encounter (Signed)
Spoke with patient. Advised of message as seen below from Dr.Jertson. Patient is agreeable and verbalizes understanding.  Routing to provider for final review. Patient agreeable to disposition. Will close encounter.  

## 2015-12-21 NOTE — Telephone Encounter (Signed)
Please see telephone encounter dated 12/20/2015.

## 2016-02-15 ENCOUNTER — Other Ambulatory Visit: Payer: Self-pay | Admitting: Obstetrics and Gynecology

## 2016-02-15 DIAGNOSIS — Z0000 Encounter for general adult medical examination without abnormal findings: Secondary | ICD-10-CM

## 2016-02-18 ENCOUNTER — Ambulatory Visit (HOSPITAL_BASED_OUTPATIENT_CLINIC_OR_DEPARTMENT_OTHER)
Admission: RE | Admit: 2016-02-18 | Discharge: 2016-02-18 | Disposition: A | Discharge status: Home / Self Care | Payer: 59 | Source: Ambulatory Visit | Attending: Obstetrics and Gynecology | Admitting: Obstetrics and Gynecology

## 2016-02-18 DIAGNOSIS — R928 Other abnormal and inconclusive findings on diagnostic imaging of breast: Secondary | ICD-10-CM | POA: Insufficient documentation

## 2016-02-18 DIAGNOSIS — Z1231 Encounter for screening mammogram for malignant neoplasm of breast: Secondary | ICD-10-CM | POA: Diagnosis present

## 2016-02-18 DIAGNOSIS — Z0000 Encounter for general adult medical examination without abnormal findings: Secondary | ICD-10-CM

## 2016-02-25 ENCOUNTER — Other Ambulatory Visit: Payer: Self-pay | Admitting: Obstetrics and Gynecology

## 2016-02-25 DIAGNOSIS — R928 Other abnormal and inconclusive findings on diagnostic imaging of breast: Secondary | ICD-10-CM

## 2016-02-29 ENCOUNTER — Ambulatory Visit
Admission: RE | Admit: 2016-02-29 | Discharge: 2016-02-29 | Disposition: A | Discharge status: Home / Self Care | Payer: 59 | Source: Ambulatory Visit | Attending: Obstetrics and Gynecology | Admitting: Obstetrics and Gynecology

## 2016-02-29 DIAGNOSIS — R928 Other abnormal and inconclusive findings on diagnostic imaging of breast: Secondary | ICD-10-CM

## 2016-06-16 IMAGING — MG MM SCREEN MAMMOGRAM BILATERAL
4 series · 4 of 4 positions shown · non-contrast
Comparison: None.

CLINICAL DATA: Screening.

EXAM:
DIGITAL SCREENING BILATERAL MAMMOGRAM WITH CAD

[R CC]
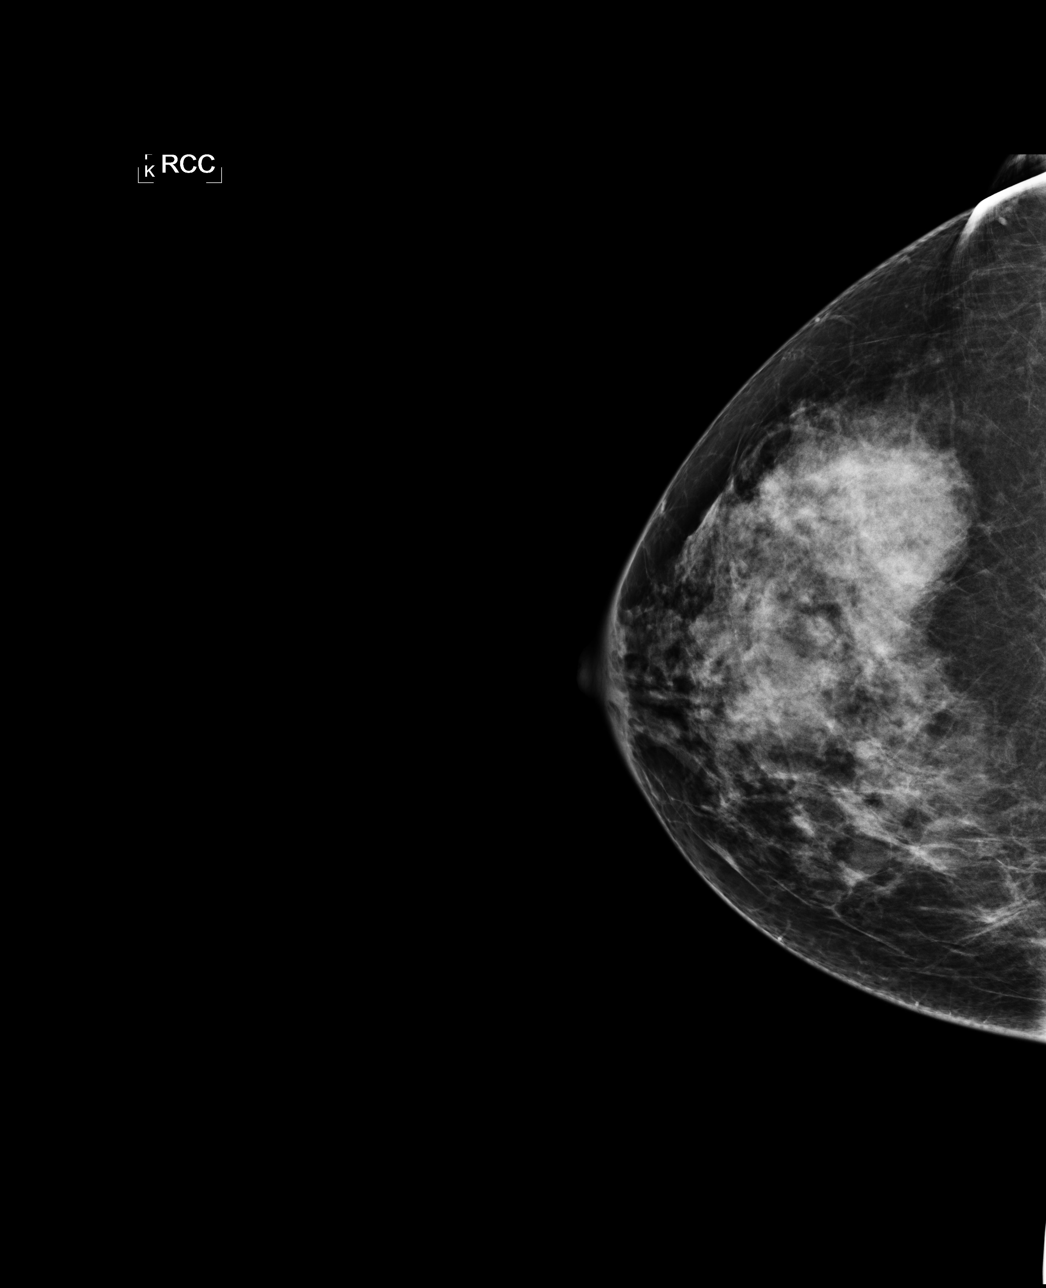

[L CC]
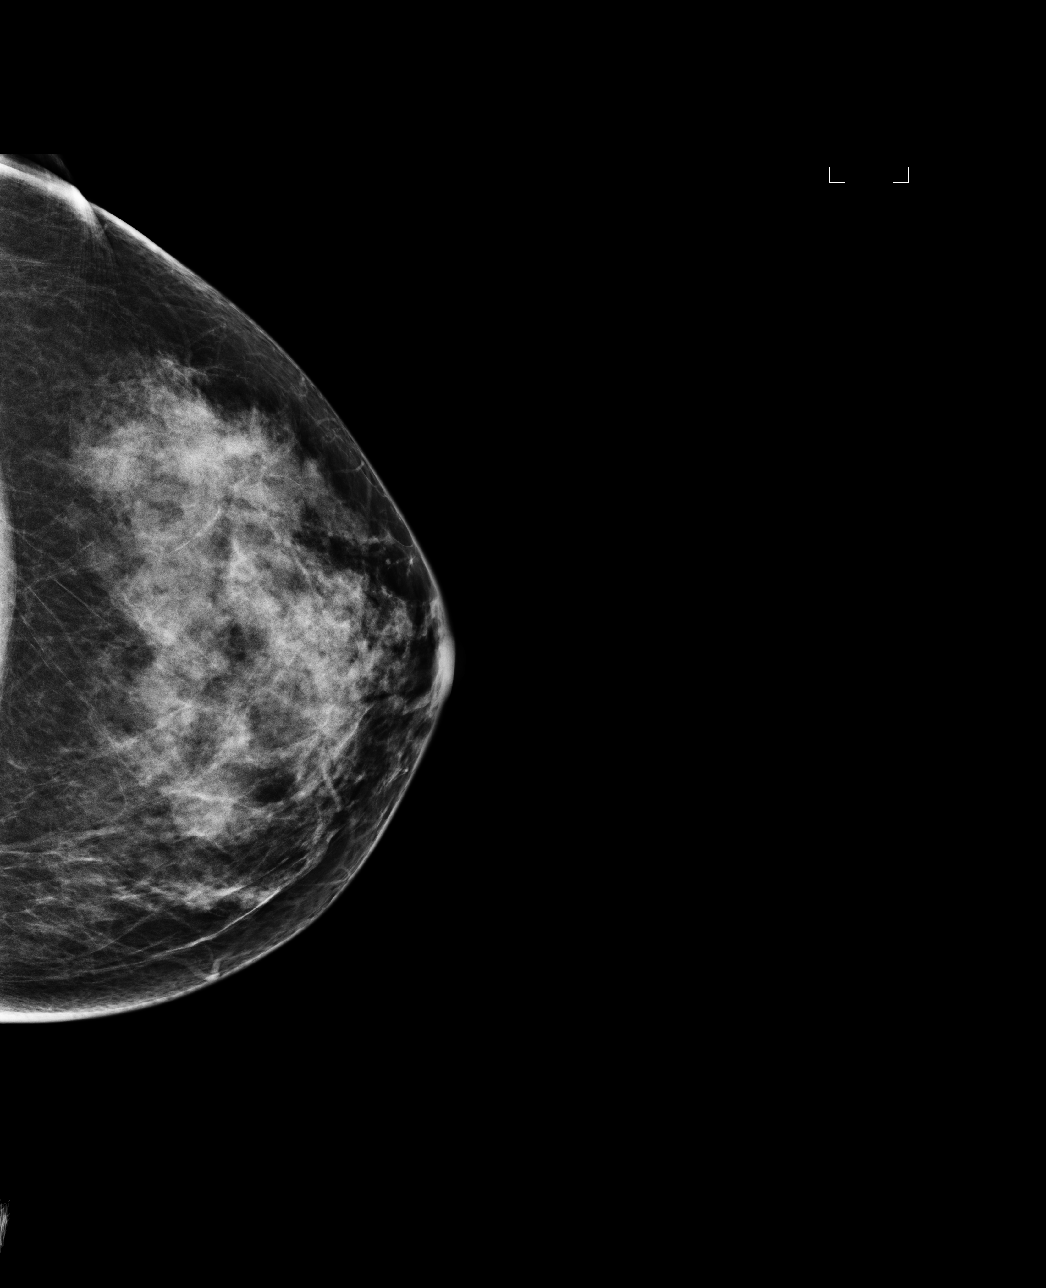

[L MLO]
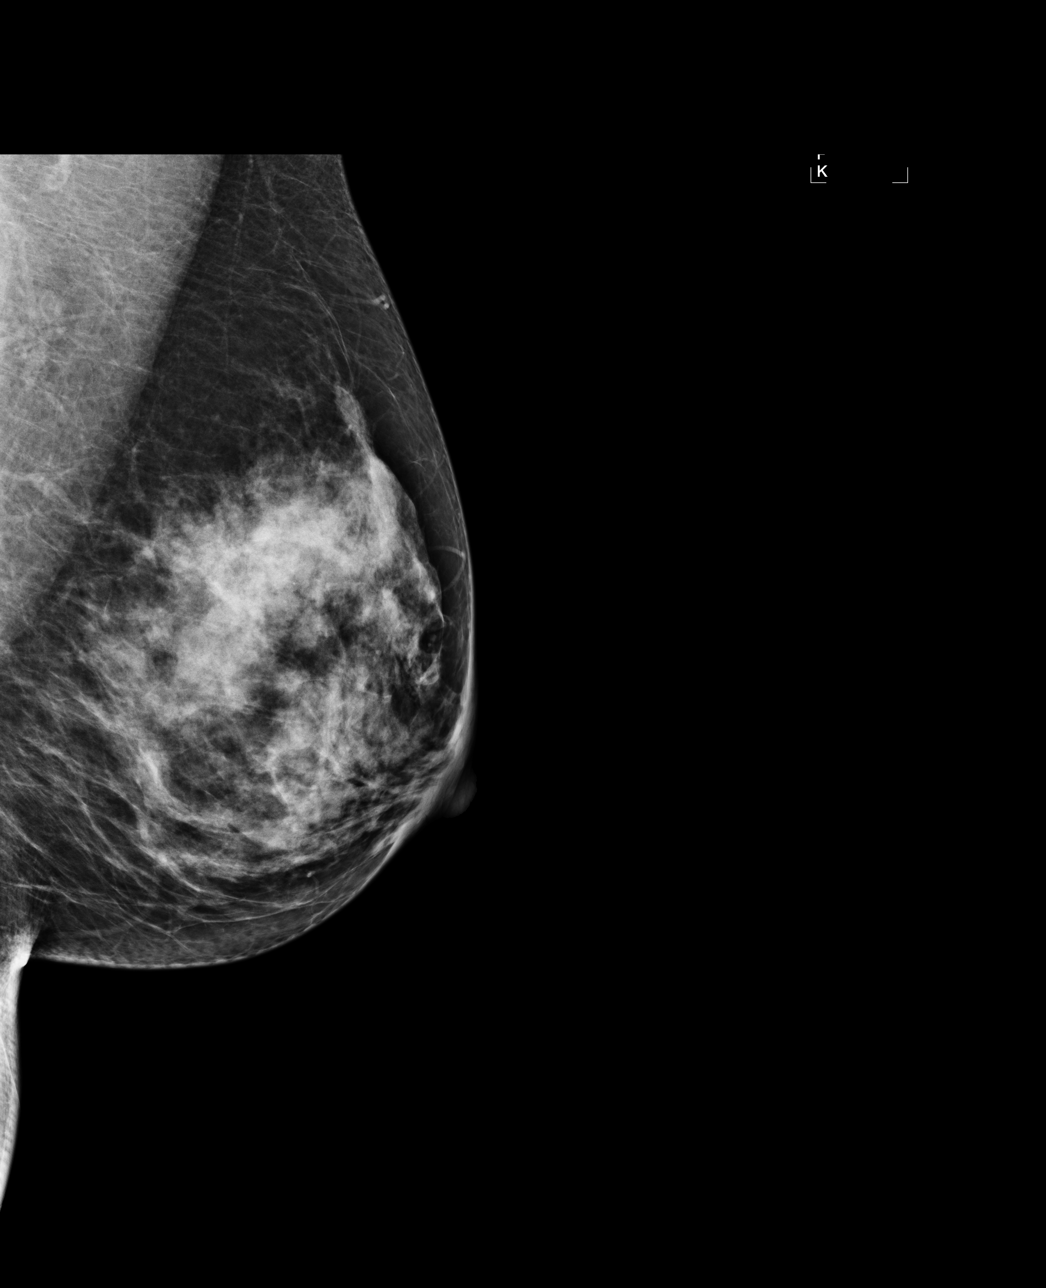

[R MLO]
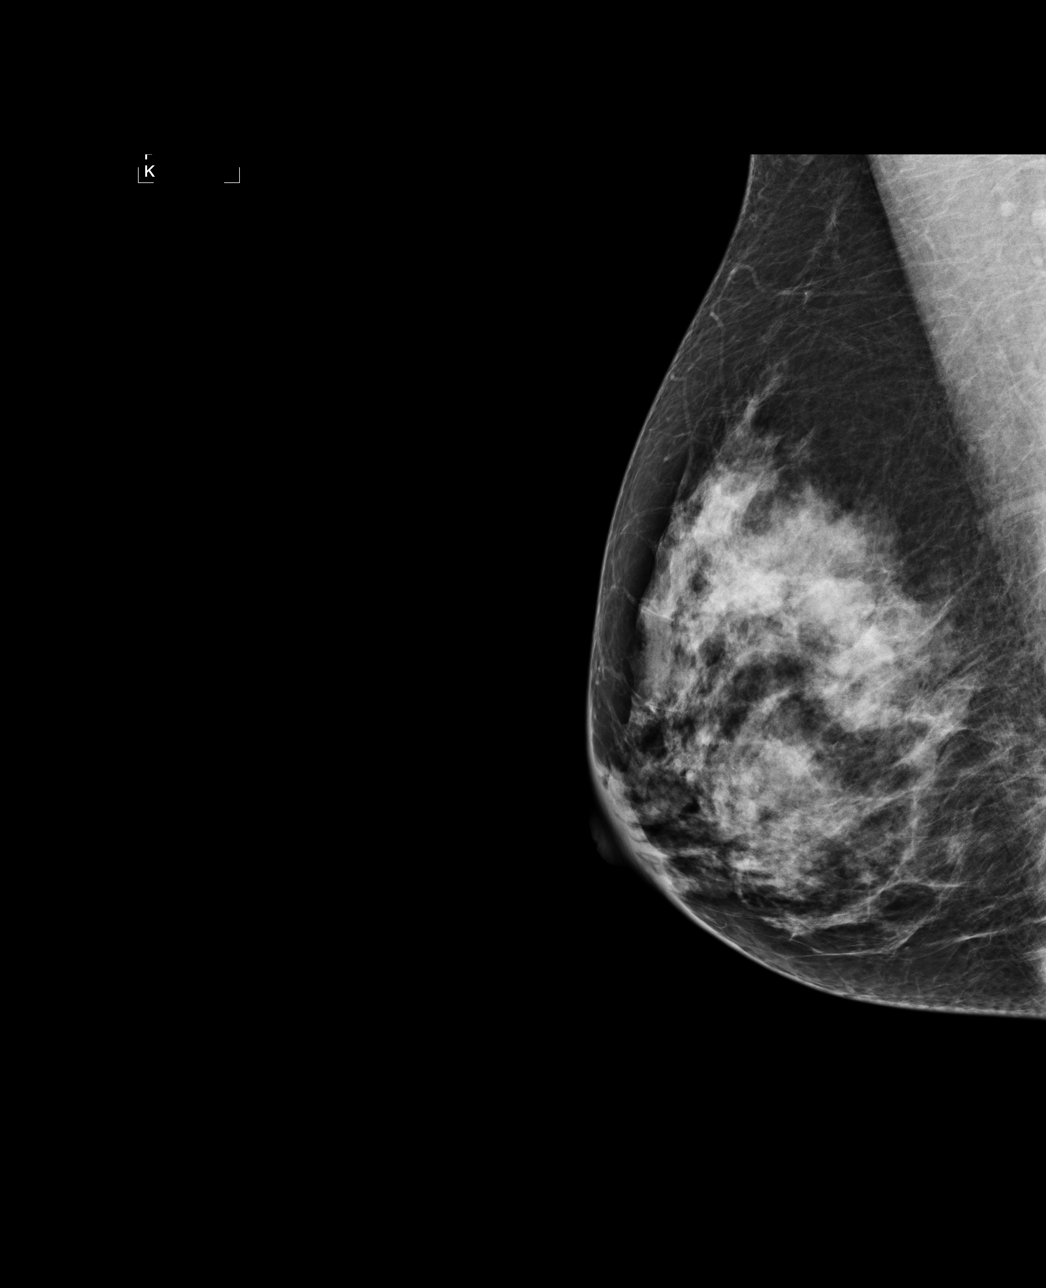

[4 of 4 positions shown; findings below may reference images not displayed]

ACR Breast Density Category c: The breast tissue is heterogeneously
dense, which may obscure small masses
FINDINGS: There are no findings suspicious for malignancy. Images were
processed with CAD.
IMPRESSION: No mammographic evidence of malignancy. A result letter of this
screening mammogram will be mailed directly to the patient.

RECOMMENDATION:
Screening mammogram in one year. (Code:U2-0-761)

BI-RADS CATEGORY  1: Negative.

## 2016-12-17 ENCOUNTER — Encounter: Payer: Self-pay | Admitting: Obstetrics and Gynecology

## 2016-12-17 ENCOUNTER — Other Ambulatory Visit: Payer: Self-pay

## 2016-12-17 ENCOUNTER — Ambulatory Visit (INDEPENDENT_AMBULATORY_CARE_PROVIDER_SITE_OTHER): Payer: 59 | Admitting: Obstetrics and Gynecology

## 2016-12-17 VITALS — BP 120/78 | HR 60 | Resp 16 | Ht 65.5 in | Wt 180.0 lb

## 2016-12-17 DIAGNOSIS — Z01419 Encounter for gynecological examination (general) (routine) without abnormal findings: Secondary | ICD-10-CM

## 2016-12-17 DIAGNOSIS — Z0000 Encounter for general adult medical examination without abnormal findings: Secondary | ICD-10-CM | POA: Diagnosis not present

## 2016-12-17 DIAGNOSIS — N898 Other specified noninflammatory disorders of vagina: Secondary | ICD-10-CM | POA: Diagnosis not present

## 2016-12-17 DIAGNOSIS — Z833 Family history of diabetes mellitus: Secondary | ICD-10-CM | POA: Diagnosis not present

## 2016-12-17 NOTE — Patient Instructions (Signed)

## 2016-12-17 NOTE — Progress Notes (Signed)
46 y.o. [id number] MarriedCaucasianF here for annual exam. Paragard IUD placed in 11/16.  Sexually active, no pain. She c/o a couple day history of an increase in milky, white vaginal d/c. She is mildly irritated, not itchy.  Period Cycle (Days): 28 Period Duration (Days): 7 days  Period Pattern: Regular Menstrual Flow: Light, Moderate Menstrual Control: Other (Comment) Menstrual Control Change Freq (Hours): changes Diva cup every 8-10 hours  Dysmenorrhea: (!) Mild Dysmenorrhea Symptoms: Cramping  Patient's last menstrual period was 12/03/2016.          Sexually active: Yes.    The current method of family planning is IUD.    Exercising: Yes.    yoga Smoker:  no  Health Maintenance: Pap:  12-07-14 WNL NEG HR HPV  History of abnormal Pap:  no MMG:  1-26-18WNL Colonoscopy:  Never BMD:   Never TDaP:  12-07-14 Gardasil: N/A   reports that  has never smoked. she has never used smokeless tobacco. She reports that she drinks about 1.2 oz of alcohol per week. She reports that she does not use drugs. Got married last year. 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   Past Medical History:  Diagnosis Date  . Chronic low back pain   . History of kidney stones    at age 46  . Sinusitis   . STD (sexually transmitted disease)    hx of HSVII since age 46    History reviewed. No pertinent surgical history.  Current Outpatient Medications  Medication Sig Dispense Refill  . loratadine (CLARITIN) 10 MG tablet Take 10 mg by mouth daily as needed for allergies.    . valACYclovir (VALTREX) 500 MG tablet 1 tab po bid x 3 days prn 30 tablet 1   No current facility-administered medications for this visit.     Family History  Problem Relation Age of Onset  . Heart disease Father   . Other Father        Dec MVA due to poss heart attack at age 46  . Diabetes Father        AODM  . Hypertension Father   . Heart attack Sister [id number]  . Hypertension Mother   . Heart disease Sister   . Stroke Maternal  Grandmother     Review of Systems  Constitutional: Negative.   HENT: Negative.   Eyes: Negative.   Respiratory: Negative.   Cardiovascular: Negative.   Gastrointestinal: Negative.   Endocrine: Negative.   Genitourinary: Positive for vaginal discharge.       Vaginal itching   Musculoskeletal: Negative.   Skin: Negative.   Allergic/Immunologic: Negative.   Neurological: Negative.   Psychiatric/Behavioral: Negative.     Exam:   BP 120/78 (BP Location: Right Arm, Patient Position: Sitting, Cuff Size: Normal)   Pulse 60   Resp 16   Ht 5' 5.5" (1.664 m)   Wt 180 lb (81.6 kg)   LMP 12/03/2016   BMI 29.50 kg/m   Weight change: @WEIGHTCHANGE @ Height:   Height: 5' 5.5" (166.4 cm)  Ht Readings from Last 3 Encounters:  12/17/16 5' 5.5" (1.664 m)  12/17/15 5' 5.5" (1.664 m)  12/03/15 5' 5.5" (1.664 m)    General appearance: alert, cooperative and appears stated age Head: Normocephalic, without obvious abnormality, atraumatic Neck: no adenopathy, supple, symmetrical, trachea midline and thyroid normal to inspection and palpation Lungs: clear to auscultation bilaterally Cardiovascular: regular rate and rhythm Breasts: normal appearance, no masses or tenderness Abdomen: soft, non-tender; non distended,  no masses,  no  organomegaly Extremities: extremities normal, atraumatic, no cyanosis or edema Skin: Skin color, texture, turgor normal. No rashes or lesions Lymph nodes: Cervical, supraclavicular, and axillary nodes normal. No abnormal inguinal nodes palpated Neurologic: Grossly normal   Pelvic: External genitalia:  no lesions              Urethra:  normal appearing urethra with no masses, tenderness or lesions              Bartholins and Skenes: normal                 Vagina: normal appearing vagina with normal color and discharge, no lesions              Cervix: no lesions and IUD string 3 cm               Bimanual Exam:  Uterus:  normal size, contour, position, consistency,  mobility, non-tender              Adnexa: no mass, fullness, tenderness               Rectovaginal: Confirms               Anus:  normal sphincter tone, no lesions  Chaperone was present for exam.  A:  Well Woman with normal exam  Family history of DM  Mild vulvar irritation and increase in vaginal discharge  P:   Pap next  Screening labs, will check HgbA1C  Discussed breast self exam  Discussed calcium and vit D intake  Affirm sent, vulvar skin care information given, use Vaseline as needed

## 2016-12-18 LAB — HEMOGLOBIN A1C
Est. average glucose Bld gHb Est-mCnc: 100 mg/dL
HEMOGLOBIN A1C: 5.1 % (ref 4.8–5.6)

## 2016-12-18 LAB — COMPREHENSIVE METABOLIC PANEL
ALBUMIN: 4.6 g/dL (ref 3.5–5.5)
ALT: 14 IU/L (ref 0–32)
AST: 19 IU/L (ref 0–40)
Albumin/Globulin Ratio: 1.9 (ref 1.2–2.2)
Alkaline Phosphatase: 75 IU/L (ref 39–117)
BUN / CREAT RATIO: 13 (ref 9–23)
BUN: 11 mg/dL (ref 6–24)
Bilirubin Total: 0.7 mg/dL (ref 0.0–1.2)
CO2: 25 mmol/L (ref 20–29)
CREATININE: 0.82 mg/dL (ref 0.57–1.00)
Calcium: 9.6 mg/dL (ref 8.7–10.2)
Chloride: 100 mmol/L (ref 96–106)
GFR, EST AFRICAN AMERICAN: 99 mL/min/{1.73_m2} (ref >59)
GFR, EST NON AFRICAN AMERICAN: 86 mL/min/{1.73_m2} (ref >59)
GLUCOSE: 80 mg/dL (ref 65–99)
Globulin, Total: 2.4 g/dL (ref 1.5–4.5)
Potassium: 4.5 mmol/L (ref 3.5–5.2)
Sodium: 141 mmol/L (ref 134–144)
TOTAL PROTEIN: 7 g/dL (ref 6.0–8.5)

## 2016-12-18 LAB — CBC
HEMATOCRIT: 41.5 % (ref 34.0–46.6)
Hemoglobin: 14.1 g/dL (ref 11.1–15.9)
MCH: 31.7 pg (ref 26.6–33.0)
MCHC: 34 g/dL (ref 31.5–35.7)
MCV: 93 fL (ref 79–97)
Platelets: 288 10*3/uL (ref 150–379)
RBC: 4.45 x10E6/uL (ref 3.77–5.28)
RDW: 12.7 % (ref 12.3–15.4)
WBC: 6.8 10*3/uL (ref 3.4–10.8)

## 2016-12-18 LAB — LIPID PANEL
CHOL/HDL RATIO: 2.7 ratio (ref 0.0–4.4)
Cholesterol, Total: 181 mg/dL (ref 100–199)
HDL: 67 mg/dL (ref >39)
LDL CALC: 100 mg/dL — AB (ref 0–99)
TRIGLYCERIDES: 72 mg/dL (ref 0–149)
VLDL CHOLESTEROL CAL: 14 mg/dL (ref 5–40)

## 2016-12-18 LAB — VAGINITIS/VAGINOSIS, DNA PROBE
CANDIDA SPECIES: NEGATIVE
GARDNERELLA VAGINALIS: NEGATIVE
Trichomonas vaginosis: NEGATIVE

## 2016-12-28 ENCOUNTER — Encounter: Payer: Self-pay | Admitting: Obstetrics and Gynecology

## 2016-12-29 ENCOUNTER — Telehealth: Payer: Self-pay | Admitting: *Deleted

## 2016-12-29 NOTE — Telephone Encounter (Signed)
Spoke with patient. Reports continued cloudy urine, no odor and pain at end of stream when voiding. Reports Hx of degenerative disk disease, unsure if lower back pain is r/t chronic pain or possible UTI.   Denies blood in urine, fever/chills, N/V.   Recommended OV for further evaluation. Scheduled for OV 11/27 at 1pm with Dr. Oscar LaJertson. Patient verbalizes understanding and is agreeable.   Routing to provider for final review. Patient is agreeable to disposition. Will close encounter.   From Stock, Lauren Randolph, Lauren Milo Evelyn, MD Sent 12/28/2016 7:14 AM  Hi Dr Oscar LaJertson,  I am very sure that I have a UTI. Cloudy urine and the sensation to continue to go when the stream has stopped. Not painful but near to it, only at the point of the end of the urination stream. I have been feeling this for about 3 days. Is it possible to call in some medication? I have been out of town since last Thursday and will return tonight so I can pick it up on Monday. My preferred pharmacy is the [facility] [place] [address] at [address].  Thank you!  Lauren Randolph

## 2016-12-29 NOTE — Telephone Encounter (Signed)
See telephone encounter dated 12/29/16.

## 2016-12-30 ENCOUNTER — Other Ambulatory Visit: Payer: Self-pay

## 2016-12-30 ENCOUNTER — Encounter: Payer: Self-pay | Admitting: Obstetrics and Gynecology

## 2016-12-30 ENCOUNTER — Ambulatory Visit (INDEPENDENT_AMBULATORY_CARE_PROVIDER_SITE_OTHER): Payer: 59 | Admitting: Obstetrics and Gynecology

## 2016-12-30 VITALS — BP 122/80 | HR 80 | Resp 16 | Wt 180.0 lb

## 2016-12-30 DIAGNOSIS — R350 Frequency of micturition: Secondary | ICD-10-CM | POA: Diagnosis not present

## 2016-12-30 DIAGNOSIS — N3090 Cystitis, unspecified without hematuria: Secondary | ICD-10-CM | POA: Diagnosis not present

## 2016-12-30 LAB — POCT URINALYSIS DIPSTICK
BILIRUBIN UA: NEGATIVE
Glucose, UA: NEGATIVE
KETONES UA: NEGATIVE
NITRITE UA: NEGATIVE
PH UA: 5.5 (ref 5.0–8.0)
PROTEIN UA: NEGATIVE
Urobilinogen, UA: NEGATIVE E.U./dL — AB

## 2016-12-30 MED ORDER — SULFAMETHOXAZOLE-TRIMETHOPRIM 800-160 MG PO TABS: 1.0000 | ORAL_TABLET | Freq: Two times a day (BID) | ORAL | 0 refills | Status: DC

## 2016-12-30 MED ORDER — PHENAZOPYRIDINE HCL 200 MG PO TABS: 200.0000 mg | ORAL_TABLET | Freq: Three times a day (TID) | ORAL | 0 refills | Status: DC | PRN

## 2016-12-30 NOTE — Patient Instructions (Signed)

## 2016-12-30 NOTE — Progress Notes (Signed)
GYNECOLOGY  VISIT   HPI: 46 y.o.   Married  Caucasian  female   G2P0020 with Patient's last menstrual period was 12/03/2016.   here c/o dysuria and urinary frequency X 7 days. Symptoms started out mild and have gotten worse. The burning has gotten worse in the last 1-2 days. Voiding small amounts (mostly). No fever, no change in her back pain.  Cycle is due tomorrow.       GYNECOLOGIC HISTORY: Patient's last menstrual period was 12/03/2016. Contraception: IUD  Menopausal hormone therapy: none        OB History    Gravida Para Term Preterm AB Living   2 0 0 0 2 0   SAB TAB Ectopic Multiple Live Births   0 2 0 0           There are no active problems to display for this patient.   Past Medical History:  Diagnosis Date  . Chronic low back pain   . History of kidney stones    at age 89  . Sinusitis   . STD (sexually transmitted disease)    hx of HSVII since age 46    History reviewed. No pertinent surgical history.  Current Outpatient Medications  Medication Sig Dispense Refill  . loratadine (CLARITIN) 10 MG tablet Take 10 mg by mouth daily as needed for allergies.    . valACYclovir (VALTREX) 500 MG tablet 1 tab po bid x 3 days prn 30 tablet 1   No current facility-administered medications for this visit.      ALLERGIES: Patient has no known allergies.  Family History  Problem Relation Age of Onset  . Heart disease Father   . Other Father        Dec MVA due to poss heart attack at age 46  . Diabetes Father        AODM  . Hypertension Father   . Heart attack Sister [id number]  . Hypertension Mother   . Heart disease Sister   . Stroke Maternal Grandmother     Social History   Socioeconomic History  . Marital status: Married    Spouse name: Not on file  . Number of children: Not on file  . Years of education: Not on file  . Highest education level: Not on file  Social Needs  . Financial resource strain: Not on file  . Food insecurity - worry: Not on file  . Food  insecurity - inability: Not on file  . Transportation needs - medical: Not on file  . Transportation needs - non-medical: Not on file  Occupational History  . Not on file  Tobacco Use  . Smoking status: Never Smoker  . Smokeless tobacco: Never Used  Substance and Sexual Activity  . Alcohol use: Yes    Alcohol/week: 1.2 oz    Types: 1 Glasses of wine, 1 Standard drinks or equivalent per week  . Drug use: No  . Sexual activity: Yes    Partners: Male    Birth control/protection: IUD  Other Topics Concern  . Not on file  Social History Narrative  . Not on file    Review of Systems  Constitutional: Negative.   HENT: Negative.   Eyes: Negative.   Respiratory: Negative.   Cardiovascular: Negative.   Gastrointestinal: Negative.   Genitourinary: Positive for dysuria and urgency.  Musculoskeletal: Negative.   Skin: Negative.   Neurological: Negative.   Endo/Heme/Allergies: Negative.   Psychiatric/Behavioral: Negative.     PHYSICAL EXAMINATION:  BP 122/80 (BP Location: Right Arm, Patient Position: Sitting, Cuff Size: Normal)   Pulse 80   Resp 16   Wt 180 lb (81.6 kg)   LMP 12/03/2016   BMI 29.50 kg/m     General appearance: alert, cooperative and appears stated age Abdomen: soft, non-tender; non distended, no masses,  no organomegaly CVA: not tender   Urine dip: 3+Blood, 3+Leuk  ASSESSMENT Cystitis  PLAN Treat with Bactrim and pyridium Send urine for ua, c&s   An After Visit Summary was printed and given to the patient.

## 2016-12-31 LAB — URINALYSIS, MICROSCOPIC ONLY
CASTS: NONE SEEN /LPF
RBC, UA: >30 /hpf — AB (ref 0–?)
WBC, UA: >30 /hpf — AB (ref 0–?)

## 2017-01-02 LAB — URINE CULTURE

## 2017-02-06 ENCOUNTER — Other Ambulatory Visit: Payer: Self-pay | Admitting: Obstetrics and Gynecology

## 2017-02-06 DIAGNOSIS — Z139 Encounter for screening, unspecified: Secondary | ICD-10-CM

## 2017-03-05 ENCOUNTER — Ambulatory Visit
Admission: RE | Admit: 2017-03-05 | Discharge: 2017-03-05 | Disposition: A | Discharge status: Home / Self Care | Payer: 59 | Source: Ambulatory Visit | Attending: Obstetrics and Gynecology | Admitting: Obstetrics and Gynecology

## 2017-03-05 DIAGNOSIS — Z139 Encounter for screening, unspecified: Secondary | ICD-10-CM

## 2018-01-20 NOTE — Progress Notes (Signed)
47 y.o. [id number] Married White or Caucasian Not Hispanic or Latino female here for annual exam.  She has a paragard IUD, placed in 11/16. Cycles are typically clock work, cycle in 11/19 was a week late and then light spotting a week later. Her last cycle was normal.  Flying to [place] for a 12 day vacation.  Period Cycle (Days): 28 Period Duration (Days): 7 days Period Pattern: Regular Menstrual Flow: Moderate, Light Menstrual Control: Other (Comment) Menstrual Control Change Freq (Hours): empties diva cup every 7 hours Dysmenorrhea: (!) Mild(more cramping before menses) Dysmenorrhea Symptoms: Cramping  Sexually active, no pain.   Patient's last menstrual period was 01/07/2018 (exact date).          Sexually active: Yes.    The current method of family planning is IUD.    Exercising: No.  The patient does not participate in regular exercise at present. Smoker:  No  Health Maintenance: Pap:  12-07-14 WNL NEG HR HPV  History of abnormal Pap:  no MMG:  03/05/2017 Birads 1 negative Colonoscopy:  Never BMD:   Never TDaP:  12-07-14 Gardasil: N/A   reports that she has never smoked. She has never used smokeless tobacco. She reports current alcohol use of about 2.0 standard drinks of alcohol per week. She reports that she does not use drugs. 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 Husband is a Investment banker, operationalChef.   Past Medical History:  Diagnosis Date  . Chronic low back pain   . History of kidney stones    at age 89  . Sinusitis   . STD (sexually transmitted disease)    hx of HSVII since age 47    History reviewed. No pertinent surgical history.  Current Outpatient Medications  Medication Sig Dispense Refill  . loratadine (CLARITIN) 10 MG tablet Take 10 mg by mouth daily as needed for allergies.    Marland Kitchen. PARAGARD INTRAUTERINE COPPER IU by Intrauterine route.    . valACYclovir (VALTREX) 500 MG tablet 1 tab po bid x 3 days prn 30 tablet 1   No current facility-administered medications for this visit.      Family History  Problem Relation Age of Onset  . Heart disease Father   . Other Father        Dec MVA due to poss heart attack at age 47  . Diabetes Father        AODM  . Hypertension Father   . Heart attack Father   . Heart attack Sister [id number]  . Hypertension Mother   . Heart disease Sister   . Stroke Maternal Grandmother   . Brain cancer Paternal Uncle   . Brain cancer Paternal Grandfather     Review of Systems  Constitutional: Negative.   HENT: Negative.   Eyes: Negative.   Respiratory: Negative.   Cardiovascular: Negative.   Gastrointestinal: Negative.   Endocrine: Negative.   Genitourinary:       Menstrual changes  Musculoskeletal: Negative.   Skin: Negative.   Allergic/Immunologic: Negative.   Neurological: Negative.   Hematological: Negative.   Psychiatric/Behavioral: Negative.     Exam:   BP 132/82 (BP Location: Right Arm, Patient Position: Sitting, Cuff Size: Normal)   Pulse 64   Ht 5' 5.55" (1.665 m)   Wt 184 lb 3.2 oz (83.6 kg)   LMP 01/07/2018 (Exact Date) Comment: Paragard IUD  BMI 30.14 kg/m   Weight change: @WEIGHTCHANGE @ Height:   Height: 5' 5.55" (166.5 cm)  Ht Readings from Last 3 Encounters:  01/21/18 5'  5.55" (1.665 m)  12/17/16 5' 5.5" (1.664 m)  12/17/15 5' 5.5" (1.664 m)    General appearance: alert, cooperative and appears stated age Head: Normocephalic, without obvious abnormality, atraumatic Neck: no adenopathy, supple, symmetrical, trachea midline and thyroid normal to inspection and palpation Lungs: clear to auscultation bilaterally Cardiovascular: regular rate and rhythm Breasts: in the right breast at 10-11 o'clock, near the periphery is a 2 cm, smooth mobile lump, not felt on the left. No skin changes Abdomen: soft, non-tender; non distended,  no masses,  no organomegaly Extremities: extremities normal, atraumatic, no cyanosis or edema Skin: Skin color, texture, turgor normal. No rashes or lesions Lymph nodes: Cervical,  supraclavicular, and axillary nodes normal. No abnormal inguinal nodes palpated Neurologic: Grossly normal   Pelvic: External genitalia:  no lesions              Urethra:  normal appearing urethra with no masses, tenderness or lesions              Bartholins and Skenes: normal                 Vagina: normal appearing vagina with normal color and discharge, no lesions              Cervix: no lesions, IUD string 3 cm               Bimanual Exam:  Uterus:  normal size, contour, position, consistency, mobility, non-tender              Adnexa: no mass, fullness, tenderness               Rectovaginal: Confirms               Anus:  normal sphincter tone, no lesions  Chaperone was present for exam.  A:  Well Woman with normal exam  BMI 30  FH of DM  Right breast lump 10-11 o'clock near the periphery  Paragard IUD check, doing well  P:   Pap with hpv  Diagnostic breast imaging  Cologuard  Screening labs, TSH, HgbA1C  Discussed breast self exam  Discussed calcium and vit D intake

## 2018-01-21 ENCOUNTER — Encounter: Payer: Self-pay | Admitting: Obstetrics and Gynecology

## 2018-01-21 ENCOUNTER — Other Ambulatory Visit: Payer: Self-pay

## 2018-01-21 ENCOUNTER — Other Ambulatory Visit (HOSPITAL_COMMUNITY)
Admission: RE | Admit: 2018-01-21 | Discharge: 2018-01-21 | Disposition: A | Discharge status: Home / Self Care | Payer: 59 | Source: Ambulatory Visit | Attending: Obstetrics and Gynecology | Admitting: Obstetrics and Gynecology

## 2018-01-21 ENCOUNTER — Ambulatory Visit (INDEPENDENT_AMBULATORY_CARE_PROVIDER_SITE_OTHER): Payer: 59 | Admitting: Obstetrics and Gynecology

## 2018-01-21 VITALS — BP 132/82 | HR 64 | Ht 65.55 in | Wt 184.2 lb

## 2018-01-21 DIAGNOSIS — Z01419 Encounter for gynecological examination (general) (routine) without abnormal findings: Secondary | ICD-10-CM | POA: Diagnosis not present

## 2018-01-21 DIAGNOSIS — Z0000 Encounter for general adult medical examination without abnormal findings: Secondary | ICD-10-CM | POA: Diagnosis not present

## 2018-01-21 DIAGNOSIS — Z124 Encounter for screening for malignant neoplasm of cervix: Secondary | ICD-10-CM

## 2018-01-21 DIAGNOSIS — Z6830 Body mass index (BMI) 30.0-30.9, adult: Secondary | ICD-10-CM

## 2018-01-21 DIAGNOSIS — Z1211 Encounter for screening for malignant neoplasm of colon: Secondary | ICD-10-CM

## 2018-01-21 DIAGNOSIS — Z833 Family history of diabetes mellitus: Secondary | ICD-10-CM

## 2018-01-21 DIAGNOSIS — N6310 Unspecified lump in the right breast, unspecified quadrant: Secondary | ICD-10-CM

## 2018-01-21 NOTE — Progress Notes (Signed)
Patient is scheduled for R Breast Diagnostic Mammogram and R  Breast Ultrasound at [facility] on 02/08/18 at 740 . Patient agreeable to time/date/location.

## 2018-01-21 NOTE — Patient Instructions (Signed)
EXERCISE AND DIET:  We recommended that you start or continue a regular exercise program for good health. Regular exercise means any activity that makes your heart beat faster and makes you sweat.  We recommend exercising at least 30 minutes per day at least 3 days a week, preferably 4 or 5.  We also recommend a diet low in fat and sugar.  Inactivity, poor dietary choices and obesity can cause diabetes, heart attack, stroke, and kidney damage, among others.    ALCOHOL AND SMOKING:  Women should limit their alcohol intake to no more than 7 drinks/beers/glasses of wine (combined, not each!) per week. Moderation of alcohol intake to this level decreases your risk of breast cancer and liver damage. And of course, no recreational drugs are part of a healthy lifestyle.  And absolutely no smoking or even second hand smoke. Most people know smoking can cause heart and lung diseases, but did you know it also contributes to weakening of your bones? Aging of your skin?  Yellowing of your teeth and nails?  CALCIUM AND VITAMIN D:  Adequate intake of calcium and Vitamin D are recommended.  The recommendations for exact amounts of these supplements seem to change often, but generally speaking 1,000 mg of calcium (between diet and supplement) and 800 units of Vitamin D per day seems prudent. Certain women may benefit from higher intake of Vitamin D.  If you are among these women, your doctor will have told you during your visit.    PAP SMEARS:  Pap smears, to check for cervical cancer or precancers,  have traditionally been done yearly, although recent scientific advances have shown that most women can have pap smears less often.  However, every woman still should have a physical exam from her gynecologist every year. It will include a breast check, inspection of the vulva and vagina to check for abnormal growths or skin changes, a visual exam of the cervix, and then an exam to evaluate the size and shape of the uterus and  ovaries.  And after 47 years of age, a rectal exam is indicated to check for rectal cancers. We will also provide age appropriate advice regarding health maintenance, like when you should have certain vaccines, screening for sexually transmitted diseases, bone density testing, colonoscopy, mammograms, etc.   MAMMOGRAMS:  All women over 40 years old should have a yearly mammogram. Many facilities now offer a "3D" mammogram, which may cost around $50 extra out of pocket. If possible,  we recommend you accept the option to have the 3D mammogram performed.  It both reduces the number of women who will be called back for extra views which then turn out to be normal, and it is better than the routine mammogram at detecting truly abnormal areas.    COLON CANCER SCREENING: Now recommend starting at age 45. At this time colonoscopy is not covered for routine screening until 50. There are take home tests that can be done between 45-49.   COLONOSCOPY:  Colonoscopy to screen for colon cancer is recommended for all women at age 50.  We know, you hate the idea of the prep.  We agree, BUT, having colon cancer and not knowing it is worse!!  Colon cancer so often starts as a polyp that can be seen and removed at colonscopy, which can quite literally save your life!  And if your first colonoscopy is normal and you have no family history of colon cancer, most women don't have to have it again for   10 years.  Once every ten years, you can do something that may end up saving your life, right?  We will be happy to help you get it scheduled when you are ready.  Be sure to check your insurance coverage so you understand how much it will cost.  It may be covered as a preventative service at no cost, but you should check your particular policy.      Breast Self-Awareness Breast self-awareness means being familiar with how your breasts look and feel. It involves checking your breasts regularly and reporting any changes to your  health care provider. Practicing breast self-awareness is important. A change in your breasts can be a sign of a serious medical problem. Being familiar with how your breasts look and feel allows you to find any problems early, when treatment is more likely to be successful. All women should practice breast self-awareness, including women who have had breast implants. How to do a breast self-exam One way to learn what is normal for your breasts and whether your breasts are changing is to do a breast self-exam. To do a breast self-exam: Look for Changes  1. Remove all the clothing above your waist. 2. Stand in front of a mirror in a room with good lighting. 3. Put your hands on your hips. 4. Push your hands firmly downward. 5. Compare your breasts in the mirror. Look for differences between them (asymmetry), such as: ? Differences in shape. ? Differences in size. ? Puckers, dips, and bumps in one breast and not the other. 6. Look at each breast for changes in your skin, such as: ? Redness. ? Scaly areas. 7. Look for changes in your nipples, such as: ? Discharge. ? Bleeding. ? Dimpling. ? Redness. ? A change in position. Feel for Changes Carefully feel your breasts for lumps and changes. It is best to do this while lying on your back on the floor and again while sitting or standing in the shower or tub with soapy water on your skin. Feel each breast in the following way:  Place the arm on the side of the breast you are examining above your head.  Feel your breast with the other hand.  Start in the nipple area and make  inch (2 cm) overlapping circles to feel your breast. Use the pads of your three middle fingers to do this. Apply light pressure, then medium pressure, then firm pressure. The light pressure will allow you to feel the tissue closest to the skin. The medium pressure will allow you to feel the tissue that is a little deeper. The firm pressure will allow you to feel the tissue  close to the ribs.  Continue the overlapping circles, moving downward over the breast until you feel your ribs below your breast.  Move one finger-width toward the center of the body. Continue to use the  inch (2 cm) overlapping circles to feel your breast as you move slowly up toward your collarbone.  Continue the up and down exam using all three pressures until you reach your armpit.  Write Down What You Find  Write down what is normal for each breast and any changes that you find. Keep a written record with breast changes or normal findings for each breast. By writing this information down, you do not need to depend only on memory for size, tenderness, or location. Write down where you are in your menstrual cycle, if you are still menstruating. If you are having trouble noticing differences   in your breasts, do not get discouraged. With time you will become more familiar with the variations in your breasts and more comfortable with the exam. How often should I examine my breasts? Examine your breasts every month. If you are breastfeeding, the best time to examine your breasts is after a feeding or after using a breast pump. If you menstruate, the best time to examine your breasts is 5-7 days after your period is over. During your period, your breasts are lumpier, and it may be more difficult to notice changes. When should I see my health care provider? See your health care provider if you notice:  A change in shape or size of your breasts or nipples.  A change in the skin of your breast or nipples, such as a reddened or scaly area.  Unusual discharge from your nipples.  A lump or thick area that was not there before.  Pain in your breasts.  Anything that concerns you.  

## 2018-01-22 LAB — CBC
HEMATOCRIT: 40.9 % (ref 34.0–46.6)
Hemoglobin: 13.8 g/dL (ref 11.1–15.9)
MCH: 31.6 pg (ref 26.6–33.0)
MCHC: 33.7 g/dL (ref 31.5–35.7)
MCV: 94 fL (ref 79–97)
PLATELETS: 258 10*3/uL (ref 150–450)
RBC: 4.37 x10E6/uL (ref 3.77–5.28)
RDW: 12 % — AB (ref 12.3–15.4)
WBC: 6 10*3/uL (ref 3.4–10.8)

## 2018-01-22 LAB — COMPREHENSIVE METABOLIC PANEL
A/G RATIO: 2 (ref 1.2–2.2)
ALK PHOS: 80 IU/L (ref 39–117)
ALT: 17 IU/L (ref 0–32)
AST: 15 IU/L (ref 0–40)
Albumin: 4.3 g/dL (ref 3.5–5.5)
BILIRUBIN TOTAL: 0.5 mg/dL (ref 0.0–1.2)
BUN/Creatinine Ratio: 19 (ref 9–23)
BUN: 16 mg/dL (ref 6–24)
CALCIUM: 9.4 mg/dL (ref 8.7–10.2)
CHLORIDE: 103 mmol/L (ref 96–106)
CO2: 26 mmol/L (ref 20–29)
Creatinine, Ser: 0.83 mg/dL (ref 0.57–1.00)
GFR calc Af Amer: 97 mL/min/{1.73_m2} (ref >59)
GFR, EST NON AFRICAN AMERICAN: 84 mL/min/{1.73_m2} (ref >59)
GLOBULIN, TOTAL: 2.1 g/dL (ref 1.5–4.5)
Glucose: 92 mg/dL (ref 65–99)
Potassium: 4.8 mmol/L (ref 3.5–5.2)
SODIUM: 139 mmol/L (ref 134–144)
Total Protein: 6.4 g/dL (ref 6.0–8.5)

## 2018-01-22 LAB — LIPID PANEL
CHOLESTEROL TOTAL: 181 mg/dL (ref 100–199)
Chol/HDL Ratio: 2.6 ratio (ref 0.0–4.4)
HDL: 69 mg/dL (ref >39)
LDL Calculated: 97 mg/dL (ref 0–99)
Triglycerides: 76 mg/dL (ref 0–149)
VLDL CHOLESTEROL CAL: 15 mg/dL (ref 5–40)

## 2018-01-22 LAB — HEMOGLOBIN A1C
ESTIMATED AVERAGE GLUCOSE: 97 mg/dL
HEMOGLOBIN A1C: 5 % (ref 4.8–5.6)

## 2018-01-22 LAB — TSH: TSH: 2.44 u[IU]/mL (ref 0.450–4.500)

## 2018-01-25 LAB — CYTOLOGY - PAP
Diagnosis: NEGATIVE
HPV: NOT DETECTED

## 2018-02-04 ENCOUNTER — Telehealth: Payer: Self-pay

## 2018-02-04 NOTE — Telephone Encounter (Signed)
Spoke with patient. Advised health forms are complete and ready for pick up. Patient requests these be placed in the mail to her home address on file. [facility] forms mailed. Encounter closed.

## 2018-02-05 ENCOUNTER — Ambulatory Visit: Payer: Self-pay | Admitting: *Deleted

## 2018-02-05 ENCOUNTER — Encounter: Payer: Self-pay | Admitting: Family Medicine

## 2018-02-05 NOTE — Telephone Encounter (Signed)
pt called with complaints of "underving feeling in ball of foot near her pinky toe" that has been going on for about 6 weeks; the pt flew from [place] and both of her calves to her feet and ankles were swollen on 02/03/2018; on 02/05/2018 she says that her left foot is still tingling , and remains puffy from midway calf down to her ankle; the pt states that she did have a throbbing pain when her left foot was swollen but now it is gone; she is concerned because of her family history of vein and vascular issues; recommendations made per nurse triage protocol; pt offered and accepted appointment with Dr Abbe Amsterdam, [facility]/ 02/08/2018 at 1600; she verbalized understanding; will route to office for notification of this upcoming appointment.   Reason for Disposition . [1] Numbness or tingling in one or both feet AND [2] is a chronic symptom (recurrent or ongoing AND present > 4 weeks)  Answer Assessment - Initial Assessment Questions 1. SYMPTOM: "What is the main symptom you are concerned about?" (e.g., weakness, numbness)     Tingling left foot; foot was numb when area was swollen 2. ONSET: "When did this start?" (minutes, hours, days; while sleeping)     About 6 weeks ago 3. LAST NORMAL: "When was the last time you were normal (no symptoms)?"     12/28/17 4. PATTERN "Does this come and go, or has it been constant since it started?"  "Is it present now?"     Yes since 02/05/2018 5. CARDIAC SYMPTOMS: "Have you had any of the following symptoms: chest pain, difficulty breathing, palpitations?" no 6. NEUROLOGIC SYMPTOMS: "Have you had any of the following symptoms: headache, dizziness, vision loss, double vision, changes in speech, unsteady on your feet?"    Numbness left pinky starting 02/05/2018 7. OTHER SYMPTOMS: "Do you have any other symptoms?"     Swelling in both legs mid calf to ankle on 02/03/2018; pt flew back from [place]  8. PREGNANCY: "Is there any chance you are pregnant?" "When was your last  menstrual period?"     No LMP 01/14/18  Protocols used: NEUROLOGIC DEFICIT-A-AH

## 2018-02-06 NOTE — Progress Notes (Signed)
[facility] [address], [address] [place], [place] [address] [phone] [phone]  Date:  02/08/2018   Name:  Lauren Randolph   DOB:  02/19/1970   MRN:  [id number]  PCP:  Pearline Cablesopland, Izadora Roehr C, MD    Chief Complaint: Tingling in Feet (6 weeks, feels like its falling asleep, swelling, left more than right, family history of vascular issues)   History of Present Illness:  Lauren Randolph is a 48 y.o. very pleasant female patient who presents with the following: Last seen by myself a little over 2 years ago for toenail fungus Lauren Randolph is a generally healthy woman, who contacted me with concern about her foot which started after a long plane flight from [place]  [facility] pt called with complaints of "underving feeling in ball of foot near her pinky toe" that has been going on for about 6 weeks; the pt flew from [place] and both of her calves to her feet and ankles were swollen on 02/03/2018; on 02/05/2018 she says that her left foot is still tingling , and remains puffy from midway calf down to her ankle; the pt states that she did have a throbbing pain when her left foot was swollen but now it is gone; she is concerned because of her family history of vein and vascular issues; recommendations made per nurse triage protocol; pt offered and accepted appointment with Dr Abbe AmsterdamJessica Jasim Harari, [facility]/ 02/08/2018 at 1600; she verbalized understanding; will route to office for notification of this upcoming appointment.   Flu shot? do today  She flew back from [place] on 12/31- 1/1.  Her feet swelled a lot on the trip back.  Both legs swelled a lot, but the left swelled more Her calves were sensitive but this resolved once the swelling went down  She currently has no calf pain or swelling. (She notes that multiple family members have had some sort of lower extremity arterial stenosis, and have required what I think is stenting surgery.  Patient herself has never  had any symptoms of this, she is able to walk long distances without any pain or claudication.)  No her calf swelling is now resolved, she continues to have a tingling and numb area on the sole of her left foot.  It is between the left third and fourth toes, she thinks has been there about 6 weeks but it is worse since the plane trip.  She does have to avoid wearing flat flip-flops as this makes it hurt more. She is not aware of any injury, she has not increased her exercise regimen recently  She has not had any chest pain or shortness of breath. She is otherwise generally in good health There are no active problems to display for this patient.   Past Medical History:  Diagnosis Date  . Chronic low back pain   . History of kidney stones    at age 89  . Sinusitis   . STD (sexually transmitted disease)    hx of HSVII since age 48    No past surgical history on file.  Social History   Tobacco Use  . Smoking status: Never Smoker  . Smokeless tobacco: Never Used  Substance Use Topics  . Alcohol use: Yes    Alcohol/week: 2.0 standard drinks    Types: 1 Glasses of wine, 1 Standard drinks or equivalent per week    Comment: occassional  . Drug use: No    Family  History  Problem Relation Age of Onset  . Heart disease Father   . Other Father        Dec MVA due to poss heart attack at age 75  . Diabetes Father        AODM  . Hypertension Father   . Heart attack Father   . Heart attack Sister 63  . Hypertension Mother   . Heart disease Sister   . Stroke Maternal Grandmother   . Brain cancer Paternal Uncle   . Brain cancer Paternal Grandfather     No Known Allergies  Medication list has been reviewed and updated.  Current Outpatient Medications on File Prior to Visit  Medication Sig Dispense Refill  . loratadine (CLARITIN) 10 MG tablet Take 10 mg by mouth daily as needed for allergies.    Marland Kitchen PARAGARD INTRAUTERINE COPPER IU by Intrauterine route.    . valACYclovir (VALTREX)  500 MG tablet 1 tab po bid x 3 days prn 30 tablet 1   No current facility-administered medications on file prior to visit.     Review of Systems:  As per HPI- otherwise negative. She was 1 of only a few people did not get sick on the recent trip.  She still has her ParaGard IUD  Physical Examination: Vitals:   02/08/18 1608  BP: 112/74  Pulse: (!) 58  Resp: 16  Temp: 98.5 F (36.9 C)  SpO2: 100%   Vitals:   02/08/18 1608  Weight: 185 lb (83.9 kg)  Height: 5' 5.5" (1.664 m)   Body mass index is 30.32 kg/m. Ideal Body Weight: Weight in (lb) to have BMI = 25: 152.2  GEN: WDWN, NAD, Non-toxic, A & O x 3, overweight, looks well HEENT: Atraumatic, Normocephalic. Neck supple. No masses, No LAD. Ears and Nose: No external deformity. CV: RRR, No M/G/R. No JVD. No thrill. No extra heart sounds. PULM: CTA B, no wheezes, crackles, rhonchi. No retractions. No resp. distress. No accessory muscle use. EXTR: No c/c/e NEURO Normal gait.  PSYCH: Normally interactive. Conversant. Not depressed or anxious appearing.  Calm demeanor.  Her calves are currently normal, not swollen or tender to calf squeeze.  Normal pulses and perfusion of both feet.  She has tenderness to squeeze test in the left forefoot between the third and fourth toe, typical of a Morton's neuroma.  Otherwise the foot and ankle exam are normal, she has no other focal tenderness, no swelling, no redness, no heat  Assessment and Plan: Left foot pain - Plan: Ambulatory referral to Sports Medicine  Family history of vascular disease  Need for immunization against influenza - Plan: Flu Vaccine QUAD 6+ mos PF IM (Fluarix Quad PF)  Here today with left foot pain which was exacerbated after an episode of swelling on a long plane flight.  The swelling was in her bilateral legs, and has now resolved.  This is likely due to stasis, and I did not at this time suspect a blood clot. She does have a history of some sort of vascular  disease in her family.  We discussed doing ABI testing, she declines at this time as she does not have claudication symptoms. She does likely have a Morton's neuroma.  I have referred her to sports medicine for further evaluation of this problem  The patient also mentions that at times, if she lays on her left side in bed, she may develop some tingling of her left arm and leg.  This resolves when she changes position.  I offered to do further imaging of her spine, or refer her to neurology.  At this time she prefers to observe this particular issue  Signed Abbe Amsterdam, MD

## 2018-02-08 ENCOUNTER — Ambulatory Visit
Admission: RE | Admit: 2018-02-08 | Discharge: 2018-02-08 | Disposition: A | Discharge status: Home / Self Care | Payer: 59 | Source: Ambulatory Visit | Attending: Obstetrics and Gynecology | Admitting: Obstetrics and Gynecology

## 2018-02-08 ENCOUNTER — Encounter: Payer: Self-pay | Admitting: Family Medicine

## 2018-02-08 ENCOUNTER — Ambulatory Visit (INDEPENDENT_AMBULATORY_CARE_PROVIDER_SITE_OTHER): Payer: 59 | Admitting: Family Medicine

## 2018-02-08 VITALS — BP 112/74 | HR 58 | Temp 98.5°F | Resp 16 | Ht 65.5 in | Wt 185.0 lb

## 2018-02-08 DIAGNOSIS — N6310 Unspecified lump in the right breast, unspecified quadrant: Secondary | ICD-10-CM

## 2018-02-08 DIAGNOSIS — Z23 Encounter for immunization: Secondary | ICD-10-CM

## 2018-02-08 DIAGNOSIS — M79672 Pain in left foot: Secondary | ICD-10-CM | POA: Diagnosis not present

## 2018-02-08 DIAGNOSIS — Z8249 Family history of ischemic heart disease and other diseases of the circulatory system: Secondary | ICD-10-CM | POA: Diagnosis not present

## 2018-02-08 NOTE — Patient Instructions (Signed)
Good to see you today, I am glad that you had such a wonderful trip to [place]. I think you likely have something called a Morton's neuroma in your left foot.  I am going to refer you to sports medicine to take a look, hopefully they can let us know if this is the correct diagnosis.  For the time being there is nothing further to do, but let me know if you have any worsening or change of your symptoms. As we discussed, typically if you have significant arterial disease to the legs will have pain and cramping with walking.  Since you are not having this symptom likely you do not have the same issues that have bothered others in your family.  However, we are certainly glad to do the noninvasive blood pressure measurements that we discussed at your convenience  You got your flu shot today

## 2018-02-11 ENCOUNTER — Telehealth: Payer: Self-pay | Admitting: Emergency Medicine

## 2018-02-11 NOTE — Telephone Encounter (Signed)
-----   Message from Romualdo BolkJill Evelyn Jertson, MD sent at 02/08/2018  2:08 PM EST ----- Take out of hold, please schedule her for a f/u breast exam in 3/20

## 2018-02-11 NOTE — Telephone Encounter (Signed)
Call to patient.  Office visit for breast check scheduled for 04/21/2018.  She will call to schedule her next screening mammogram with [facility] imaging for 03/2018. Encounter closed.

## 2018-02-23 NOTE — Patient Instructions (Addendum)
You have metatarsalgia with morton's neuroma between 4th and 5th digits. Wear supportive shoes as much as possible with sports insoles/metatarsal pads. Icing 15 minutes at a time 3-4 times a day if needed. Aleve, tylenol if needed for pain. Follow up with me in 1 month to 6 weeks. Call me if the padding feels like it's not in the right spot when you put them in your shoes.

## 2018-02-24 ENCOUNTER — Ambulatory Visit (INDEPENDENT_AMBULATORY_CARE_PROVIDER_SITE_OTHER): Payer: 59 | Admitting: Family Medicine

## 2018-02-24 ENCOUNTER — Encounter: Payer: Self-pay | Admitting: Family Medicine

## 2018-02-24 VITALS — BP 110/79 | HR 79 | Ht 66.0 in | Wt 178.0 lb

## 2018-02-24 DIAGNOSIS — M79672 Pain in left foot: Secondary | ICD-10-CM | POA: Diagnosis not present

## 2018-02-25 ENCOUNTER — Encounter: Payer: Self-pay | Admitting: Family Medicine

## 2018-02-25 NOTE — Progress Notes (Signed)
PCP and consultation requested by: Copland, Gwenlyn Found, MD  Subjective:   HPI: Patient is a 48 y.o. female here for left foot pain.  Patient reports she's had about 2 months of pain on plantar lateral left foot. No acute injury or trauma. Pain up to 3/10 and worse with walking. Can get numbness, tingling distal to this area. No treatment to date. Was walking more prior to this starting. No skin changes.  Past Medical History:  Diagnosis Date  . Chronic low back pain   . History of kidney stones    at age 39  . Sinusitis   . STD (sexually transmitted disease)    hx of HSVII since age 52    Current Outpatient Medications on File Prior to Visit  Medication Sig Dispense Refill  . loratadine (CLARITIN) 10 MG tablet Take 10 mg by mouth daily as needed for allergies.    Marland Kitchen PARAGARD INTRAUTERINE COPPER IU by Intrauterine route.    . valACYclovir (VALTREX) 500 MG tablet 1 tab po bid x 3 days prn 30 tablet 1   No current facility-administered medications on file prior to visit.     History reviewed. No pertinent surgical history.  No Known Allergies  Social History   Socioeconomic History  . Marital status: Married    Spouse name: Not on file  . Number of children: Not on file  . Years of education: Not on file  . Highest education level: Not on file  Occupational History  . Not on file  Social Needs  . Financial resource strain: Not on file  . Food insecurity:    Worry: Not on file    Inability: Not on file  . Transportation needs:    Medical: Not on file    Non-medical: Not on file  Tobacco Use  . Smoking status: Never Smoker  . Smokeless tobacco: Never Used  Substance and Sexual Activity  . Alcohol use: Yes    Alcohol/week: 2.0 standard drinks    Types: 1 Glasses of wine, 1 Standard drinks or equivalent per week    Comment: occassional  . Drug use: No  . Sexual activity: Yes    Partners: Male    Birth control/protection: I.U.D.  Lifestyle  . Physical  activity:    Days per week: Not on file    Minutes per session: Not on file  . Stress: Not on file  Relationships  . Social connections:    Talks on phone: Not on file    Gets together: Not on file    Attends religious service: Not on file    Active member of club or organization: Not on file    Attends meetings of clubs or organizations: Not on file    Relationship status: Not on file  . Intimate partner violence:    Fear of current or ex partner: Not on file    Emotionally abused: Not on file    Physically abused: Not on file    Forced sexual activity: Not on file  Other Topics Concern  . Not on file  Social History Narrative  . Not on file    Family History  Problem Relation Age of Onset  . Heart disease Father   . Other Father        Dec MVA due to poss heart attack at age 84  . Diabetes Father        AODM  . Hypertension Father   . Heart attack Father   . Heart  attack Sister [id number]  . Hypertension Mother   . Heart disease Sister   . Stroke Maternal Grandmother   . Brain cancer Paternal Uncle   . Brain cancer Paternal Grandfather     BP 110/79   Pulse 79   Ht 5\' 6"  (1.676 m)   Wt 178 lb (80.7 kg)   BMI 28.73 kg/m   Review of Systems: See HPI above.     Objective:  Physical Exam:  Gen: NAD, comfortable in exam room  Left foot/ankle: Mild transverse arch collapse.  Mild pronation.  No other gross deformity, swelling, ecchymoses FROM with 5/5 strength all directions. TTP between 4th, 5th digits.  No focal metatarsal tenderness. Negative ant drawer and talar tilt.   Negative syndesmotic compression. Positive metatarsal squeeze. Thompsons test negative. NV intact distally.  Right foot/ankle: Mild transverse arch collapse.  No other deformity. FROM with 5/5 strength. No tenderness to palpation. NVI distally.   Assessment & Plan:  1. Left foot pain - 2/2 metatarsalgia with morton's neuroma between 4th and 5th digits.  Sports insoles provided with  metatarsal pads.  Icing, aleve or tylenol if needed.  F/u in 1 month to 6 weeks.

## 2018-03-10 ENCOUNTER — Encounter: Payer: Self-pay | Admitting: Obstetrics and Gynecology

## 2018-03-31 ENCOUNTER — Ambulatory Visit: Payer: 59 | Admitting: Family Medicine

## 2018-04-20 NOTE — Progress Notes (Signed)
GYNECOLOGY  VISIT   HPI: 48 y.o.   Married White or Caucasian Not Hispanic or Latino  female   G2P0020 with Patient's last menstrual period was 04/10/2018 (exact date).   here for breast recheck.  She was noted to have a lump in her right breast in 12/19 at the time of her annual exam. Imaging was negative, showed dense fibroglandular tissue at the site in question.   She hasn't noticed any changes in her breast exam since then.  She has the paragard IUD. Recent cycle change. In February her cycle was 1.5 weeks late. Normal cycle for 6-7 days, but when it ended she noticed an odor. About 4 days after her cycle ended she started bleeding again (03/27/18), she bleed for another 6 days, heavy for a longer period of time than normal. Changing her diva cup in 7 hours. She stopped bleeding and then one week later on 04/10/18 she started having the feeling her cycle was coming, breast tenderness (didn't have this for the bleeding on 2/22), she then had light mucous spotting x 3 days. No bleeding since. She typically has cramps with her cycle, didn't have cramps with the in between bleeding. She isn't having significant vasomotor symptoms.  Normal TSH in 12/19 She was having some vaginitis like symptoms a few weeks ago, not currently.    GYNECOLOGIC HISTORY: Patient's last menstrual period was 04/10/2018 (exact date). Contraception: Paragard IUD Menopausal hormone therapy: None        OB History    Gravida  2   Para  0   Term  0   Preterm  0   AB  2   Living  0     SAB  0   TAB  2   Ectopic  0   Multiple  0   Live Births                 Patient Active Problem List   Diagnosis Date Noted  . Degenerative disc disease at L5-S1 level 08/04/2003    Past Medical History:  Diagnosis Date  . Chronic low back pain   . History of kidney stones    at age 31  . Sinusitis   . STD (sexually transmitted disease)    hx of HSVII since age 63    History reviewed. No pertinent  surgical history.  Current Outpatient Medications  Medication Sig Dispense Refill  . loratadine (CLARITIN) 10 MG tablet Take 10 mg by mouth daily as needed for allergies.    Marland Kitchen PARAGARD INTRAUTERINE COPPER IU by Intrauterine route.    . valACYclovir (VALTREX) 500 MG tablet 1 tab po bid x 3 days prn 30 tablet 1   No current facility-administered medications for this visit.      ALLERGIES: Patient has no known allergies.  Family History  Problem Relation Age of Onset  . Heart disease Father   . Other Father        Dec MVA due to poss heart attack at age 47  . Diabetes Father        AODM  . Hypertension Father   . Heart attack Father   . Heart attack Sister 28  . Hypertension Mother   . Heart disease Sister   . Stroke Maternal Grandmother   . Brain cancer Paternal Uncle   . Brain cancer Paternal Grandfather     Social History   Socioeconomic History  . Marital status: Married    Spouse name: Not on file  .  Number of children: Not on file  . Years of education: Not on file  . Highest education level: Not on file  Occupational History  . Not on file  Social Needs  . Financial resource strain: Not on file  . Food insecurity:    Worry: Not on file    Inability: Not on file  . Transportation needs:    Medical: Not on file    Non-medical: Not on file  Tobacco Use  . Smoking status: Never Smoker  . Smokeless tobacco: Never Used  Substance and Sexual Activity  . Alcohol use: Yes    Alcohol/week: 2.0 standard drinks    Types: 1 Glasses of wine, 1 Standard drinks or equivalent per week    Comment: occassional  . Drug use: No  . Sexual activity: Yes    Partners: Male    Birth control/protection: I.U.D.  Lifestyle  . Physical activity:    Days per week: Not on file    Minutes per session: Not on file  . Stress: Not on file  Relationships  . Social connections:    Talks on phone: Not on file    Gets together: Not on file    Attends religious service: Not on file     Active member of club or organization: Not on file    Attends meetings of clubs or organizations: Not on file    Relationship status: Not on file  . Intimate partner violence:    Fear of current or ex partner: Not on file    Emotionally abused: Not on file    Physically abused: Not on file    Forced sexual activity: Not on file  Other Topics Concern  . Not on file  Social History Narrative  . Not on file    Review of Systems  Constitutional: Negative.   HENT: Negative.   Eyes: Negative.   Respiratory: Negative.   Cardiovascular: Negative.   Gastrointestinal: Negative.   Genitourinary:       Menstrual changes  Musculoskeletal: Negative.   Skin: Negative.   Neurological: Negative.   Endo/Heme/Allergies: Negative.   Psychiatric/Behavioral: Negative.     PHYSICAL EXAMINATION:    BP 118/80 (BP Location: Right Arm, Patient Position: Sitting, Cuff Size: Normal)   Pulse 60   Wt 181 lb (82.1 kg)   LMP 04/10/2018 (Exact Date)   BMI 29.21 kg/m     General appearance: alert, cooperative and appears stated age Neck: no adenopathy, supple, symmetrical, trachea midline and thyroid normal to inspection and palpation Breasts: in the right breast at 10 o'clock near the periphery is a 4 x 1.5 cm smooth, mobile lump, not tender. No skin changes, no other lumps.  Pelvic: External genitalia:  no lesions              Urethra:  normal appearing urethra with no masses, tenderness or lesions              Bartholins and Skenes: normal                 Vagina: normal appearing vagina with an increase in thick, clumpy, white vaginal d/c on the wall of the vagina              Cervix: IUD string 3 cm, 2 mm endocervical polyp noted just inside the cervix              Bimanual Exam:  Uterus:  normal size, contour, position, consistency, mobility, non-tender and anteverted  Adnexa: no mass, fullness, tenderness               Chaperone was present for exam.  ASSESSMENT AUB, suspect  anovulatory bleed Breast lump, negative imaging, feels larger on exam (patient feels it fluctuates with her cycle) Vaginal discharge Very small endocervical polyp, don't think this is the source of her bleeding (not able to remove, very small and inside cervix)    PLAN UPT negative Provera 5 mg x 5 days F/U in 2 weeks for a repeat breast exam, if the lump isn't smaller will refer for a consultation with a breast surgeon Affirm sent   An After Visit Summary was printed and given to the patient.  ~25 minutes face to face time of which over 50% was spent in counseling.

## 2018-04-21 ENCOUNTER — Ambulatory Visit (INDEPENDENT_AMBULATORY_CARE_PROVIDER_SITE_OTHER): Payer: 59 | Admitting: Obstetrics and Gynecology

## 2018-04-21 ENCOUNTER — Encounter: Payer: Self-pay | Admitting: Obstetrics and Gynecology

## 2018-04-21 ENCOUNTER — Other Ambulatory Visit: Payer: Self-pay

## 2018-04-21 VITALS — BP 118/80 | HR 60 | Wt 181.0 lb

## 2018-04-21 DIAGNOSIS — N898 Other specified noninflammatory disorders of vagina: Secondary | ICD-10-CM

## 2018-04-21 DIAGNOSIS — Z30431 Encounter for routine checking of intrauterine contraceptive device: Secondary | ICD-10-CM | POA: Diagnosis not present

## 2018-04-21 DIAGNOSIS — N6311 Unspecified lump in the right breast, upper outer quadrant: Secondary | ICD-10-CM

## 2018-04-21 DIAGNOSIS — N939 Abnormal uterine and vaginal bleeding, unspecified: Secondary | ICD-10-CM

## 2018-04-21 LAB — POCT URINE PREGNANCY: PREG TEST UR: NEGATIVE

## 2018-04-21 MED ORDER — MEDROXYPROGESTERONE ACETATE 5 MG PO TABS: 5.0000 mg | ORAL_TABLET | Freq: Every day | ORAL | 0 refills | Status: DC

## 2018-04-22 LAB — VAGINITIS/VAGINOSIS, DNA PROBE
Candida Species: NEGATIVE
GARDNERELLA VAGINALIS: NEGATIVE
Trichomonas vaginosis: NEGATIVE

## 2018-04-30 NOTE — Progress Notes (Signed)
GYNECOLOGY  VISIT   HPI: 48 y.o.   Married White or Caucasian Not Hispanic or Latino  female   G2P0020 with Patient's last menstrual period was 04/24/2018 (exact date).   here for  Breast check. She is being followed for a breast lump. She feels it is smaller and less tender since she got her cycle.  She was treated with provera for a suspected anovulatory bleed. Had a cycle and feels hormonally better. No breast tenderness.   GYNECOLOGIC HISTORY: Patient's last menstrual period was 04/24/2018 (exact date). Contraception:paragard iud Menopausal hormone therapy: none        OB History    Gravida  2   Para  0   Term  0   Preterm  0   AB  2   Living  0     SAB  0   TAB  2   Ectopic  0   Multiple  0   Live Births                 Patient Active Problem List   Diagnosis Date Noted  . Degenerative disc disease at L5-S1 level 08/04/2003    Past Medical History:  Diagnosis Date  . Chronic low back pain   . History of kidney stones    at age 89  . Sinusitis   . STD (sexually transmitted disease)    hx of HSVII since age 64    History reviewed. No pertinent surgical history.  Current Outpatient Medications  Medication Sig Dispense Refill  . loratadine (CLARITIN) 10 MG tablet Take 10 mg by mouth daily as needed for allergies.    . medroxyPROGESTERone (PROVERA) 5 MG tablet Take 1 tablet (5 mg total) by mouth daily. 5 tablet 0  . PARAGARD INTRAUTERINE COPPER IU by Intrauterine route.    . valACYclovir (VALTREX) 500 MG tablet 1 tab po bid x 3 days prn 30 tablet 1   No current facility-administered medications for this visit.      ALLERGIES: Patient has no known allergies.  Family History  Problem Relation Age of Onset  . Heart disease Father   . Other Father        Dec MVA due to poss heart attack at age 76  . Diabetes Father        AODM  . Hypertension Father   . Heart attack Father   . Heart attack Sister 75  . Hypertension Mother   . Heart disease  Sister   . Stroke Maternal Grandmother   . Brain cancer Paternal Uncle   . Brain cancer Paternal Grandfather     Social History   Socioeconomic History  . Marital status: Married    Spouse name: Not on file  . Number of children: Not on file  . Years of education: Not on file  . Highest education level: Not on file  Occupational History  . Not on file  Social Needs  . Financial resource strain: Not on file  . Food insecurity:    Worry: Not on file    Inability: Not on file  . Transportation needs:    Medical: Not on file    Non-medical: Not on file  Tobacco Use  . Smoking status: Never Smoker  . Smokeless tobacco: Never Used  Substance and Sexual Activity  . Alcohol use: Yes    Alcohol/week: 0.0 - 1.0 standard drinks  . Drug use: No  . Sexual activity: Yes    Partners: Male  Birth control/protection: I.U.D.  Lifestyle  . Physical activity:    Days per week: Not on file    Minutes per session: Not on file  . Stress: Not on file  Relationships  . Social connections:    Talks on phone: Not on file    Gets together: Not on file    Attends religious service: Not on file    Active member of club or organization: Not on file    Attends meetings of clubs or organizations: Not on file    Relationship status: Not on file  . Intimate partner violence:    Fear of current or ex partner: Not on file    Emotionally abused: Not on file    Physically abused: Not on file    Forced sexual activity: Not on file  Other Topics Concern  . Not on file  Social History Narrative  . Not on file    Review of Systems  Constitutional: Negative.   HENT: Negative.   Eyes: Negative.   Respiratory: Negative.   Cardiovascular: Negative.   Gastrointestinal: Negative.   Genitourinary: Negative.   Musculoskeletal: Negative.   Skin: Negative.   Neurological: Negative.   Endo/Heme/Allergies: Negative.   Psychiatric/Behavioral: Negative.     PHYSICAL EXAMINATION:    BP 104/68    Pulse 68   Temp 98.3 F (36.8 C) (Oral)   Resp 16   Wt 179 lb (81.2 kg)   LMP 04/24/2018 (Exact Date)   BMI 28.89 kg/m     General appearance: alert, cooperative and appears stated age Breasts: 4 x 1.5 cm lump in the right breast in the upper outer quadrant. Mobile, not tender No axillary adenopathy  ASSESSMENT Right breast lump, feels larger since 12/19 exam. Negative imaging. No smaller after her cycle.  Recently treated with provera for suspected anovulatory bleed, had a cycle    PLAN Will refer to breast surgeon for evaluation Calendar bleeding, call with any changes   An After Visit Summary was printed and given to the patient.

## 2018-05-05 ENCOUNTER — Ambulatory Visit (INDEPENDENT_AMBULATORY_CARE_PROVIDER_SITE_OTHER): Payer: 59 | Admitting: Obstetrics and Gynecology

## 2018-05-05 ENCOUNTER — Encounter: Payer: Self-pay | Admitting: Obstetrics and Gynecology

## 2018-05-05 ENCOUNTER — Other Ambulatory Visit: Payer: Self-pay

## 2018-05-05 VITALS — BP 104/68 | HR 68 | Temp 98.3°F | Resp 16 | Wt 179.0 lb

## 2018-05-05 DIAGNOSIS — N970 Female infertility associated with anovulation: Secondary | ICD-10-CM | POA: Diagnosis not present

## 2018-05-05 DIAGNOSIS — N6311 Unspecified lump in the right breast, upper outer quadrant: Secondary | ICD-10-CM | POA: Diagnosis not present

## 2018-07-02 ENCOUNTER — Telehealth: Payer: Self-pay | Admitting: Obstetrics and Gynecology

## 2018-07-02 ENCOUNTER — Encounter: Payer: Self-pay | Admitting: Obstetrics and Gynecology

## 2018-07-02 NOTE — Telephone Encounter (Signed)
Patient sent the following correspondence through MyChart. Routing to triage to assist patient with request.  Hi,  I am just following up with you since my last visit you mentioned that you would refer me to a breast surgeon for their opinion. Since we are in phase 2 of reopening due to the virus, I am wondering if I may be able to make that appointment.     Thanks and hope you are doing well.  -[place]

## 2018-07-02 NOTE — Telephone Encounter (Signed)
Per review of 05/05/18 OV notes. Right breast lump larger since 12/19 exam. Negative imaging. No smaller after cycle. Breast surgeon referral recommended.   Dr. Oscar La -ok to proceed with referral to general surgeon at [facility]?

## 2018-07-02 NOTE — Telephone Encounter (Signed)
Yes, please proceed with referral.

## 2018-07-02 NOTE — Telephone Encounter (Signed)
Addendum to previous entry: referral placed to general surgeon on 05/05/18.   Routing to [employer].

## 2018-07-05 NOTE — Telephone Encounter (Signed)
Routing to Soledad Gerlach for referral f/u.

## 2018-07-14 NOTE — Telephone Encounter (Signed)
No, I am still working on this referral. I will close once an appointment has been scheduled.

## 2018-07-14 NOTE — Telephone Encounter (Signed)
OK to close or is further follow up necessary? °

## 2018-07-23 ENCOUNTER — Other Ambulatory Visit: Payer: Self-pay | Admitting: Surgery

## 2018-07-23 DIAGNOSIS — N6311 Unspecified lump in the right breast, upper outer quadrant: Secondary | ICD-10-CM

## 2018-09-09 ENCOUNTER — Other Ambulatory Visit: Payer: Self-pay

## 2018-09-09 ENCOUNTER — Ambulatory Visit
Admission: RE | Admit: 2018-09-09 | Discharge: 2018-09-09 | Disposition: A | Discharge status: Home / Self Care | Payer: 59 | Source: Ambulatory Visit | Attending: Surgery | Admitting: Surgery

## 2018-09-09 ENCOUNTER — Other Ambulatory Visit: Payer: Self-pay | Admitting: Obstetrics and Gynecology

## 2018-09-09 DIAGNOSIS — N6311 Unspecified lump in the right breast, upper outer quadrant: Secondary | ICD-10-CM

## 2018-09-09 DIAGNOSIS — Z1231 Encounter for screening mammogram for malignant neoplasm of breast: Secondary | ICD-10-CM

## 2018-09-09 MED ORDER — GADOBUTROL 1 MMOL/ML IV SOLN
8.0000 mL | Freq: Once | INTRAVENOUS | Status: AC | PRN
  Administered 2018-09-09: 8 mL via INTRAVENOUS

## 2018-09-14 ENCOUNTER — Ambulatory Visit
Admission: RE | Admit: 2018-09-14 | Discharge: 2018-09-14 | Disposition: A | Discharge status: Home / Self Care | Payer: 59 | Source: Ambulatory Visit | Attending: Obstetrics and Gynecology | Admitting: Obstetrics and Gynecology

## 2018-09-14 ENCOUNTER — Other Ambulatory Visit: Payer: Self-pay

## 2018-09-14 DIAGNOSIS — Z1231 Encounter for screening mammogram for malignant neoplasm of breast: Secondary | ICD-10-CM

## 2018-09-15 NOTE — Progress Notes (Signed)
Please call the patient and let them know that their MRI looked normal.  I'm willing to see her again to discuss this further.

## 2018-10-05 ENCOUNTER — Telehealth: Payer: Self-pay | Admitting: Obstetrics and Gynecology

## 2018-10-05 ENCOUNTER — Encounter: Payer: Self-pay | Admitting: Obstetrics and Gynecology

## 2018-10-05 NOTE — Telephone Encounter (Signed)
[name], [name]  [facility]  Phone Number: [phone]        Hi Dr. Lenna Sciara!   Hope you are doing well. I feel like I may have a yeast infection, buy it is hard to tell with my odd cycle lately. My last true period began in May 25th. Then no period for 2 months and then on Aug 17th I began about a 14 day on and off spotting period. Through that time I am experiencing no pain, however there is dryness and a smell. Is this cause for cencern? Does it seem normal of perimenepause? Maybe I should schedule an appointment to see if there is an infection?   Thanks!  Lauren Randolph

## 2018-10-05 NOTE — Telephone Encounter (Signed)
Spoke with patient. Last full menses 06/28/2018. Intermittent spotting started 2 wks ago. Paragard IUD for contraceptive. Reports vaginal dryness and "strange" odor. Denies pelvic pain, fever/chills, N/V. No change in partners or products. Has taken Provera in the past to "regulate menses". Night sweats have reduced. Recommended OV for further evaluation. [id number] prescreen negative, precautions reviewed. OV scheduled for 9/3 at 9am with Dr. Talbert Nan.   Routing to provider for final review. Patient is agreeable to disposition. Will close encounter.

## 2018-10-07 ENCOUNTER — Encounter: Payer: Self-pay | Admitting: Obstetrics and Gynecology

## 2018-10-07 ENCOUNTER — Other Ambulatory Visit: Payer: Self-pay

## 2018-10-07 ENCOUNTER — Ambulatory Visit (INDEPENDENT_AMBULATORY_CARE_PROVIDER_SITE_OTHER): Payer: 59 | Admitting: Obstetrics and Gynecology

## 2018-10-07 VITALS — BP 120/78 | HR 64 | Temp 97.1°F | Wt 186.6 lb

## 2018-10-07 DIAGNOSIS — N760 Acute vaginitis: Secondary | ICD-10-CM | POA: Diagnosis not present

## 2018-10-07 DIAGNOSIS — Z30431 Encounter for routine checking of intrauterine contraceptive device: Secondary | ICD-10-CM

## 2018-10-07 DIAGNOSIS — N951 Menopausal and female climacteric states: Secondary | ICD-10-CM

## 2018-10-07 DIAGNOSIS — B9689 Other specified bacterial agents as the cause of diseases classified elsewhere: Secondary | ICD-10-CM

## 2018-10-07 DIAGNOSIS — N926 Irregular menstruation, unspecified: Secondary | ICD-10-CM

## 2018-10-07 LAB — POCT URINE PREGNANCY: Preg Test, Ur: NEGATIVE

## 2018-10-07 MED ORDER — MEDROXYPROGESTERONE ACETATE 5 MG PO TABS
ORAL_TABLET | ORAL | 1 refills | Status: DC
Start: 2018-10-07 — End: 2019-03-03

## 2018-10-07 MED ORDER — METRONIDAZOLE 500 MG PO TABS
500.0000 mg | ORAL_TABLET | Freq: Two times a day (BID) | ORAL | 0 refills | Status: DC
Start: 2018-10-07 — End: 2019-03-03

## 2018-10-07 NOTE — Patient Instructions (Signed)
Endometrial Biopsy Post-procedure Instructions  Cramping is common.  You may take Ibuprofen, Aleve, or Tylenol for the cramping.  This should resolve within 24 hours.    You may have a small amount of spotting.  You should wear a mini pad for the next few days.  You may have intercourse in 24 hours.  You need to call the office if you have any pelvic pain, fever, heavy bleeding, or foul smelling vaginal discharge.  Shower or bathe as normal  You will be notified within one week of your biopsy results or we will discuss your results at your follow-up appointment if needed.   Perimenopause  Perimenopause is the normal time of life before and after menstrual periods stop completely (menopause). Perimenopause can begin 2-8 years before menopause, and it usually lasts for 1 year after menopause. During perimenopause, the ovaries may or may not produce an egg. What are the causes? This condition is caused by a natural change in hormone levels that happens as you get older. What increases the risk? This condition is more likely to start at an earlier age if you have certain medical conditions or treatments, including:  A tumor of the pituitary gland in the brain.  A disease that affects the ovaries and hormone production.  Radiation treatment for cancer.  Certain cancer treatments, such as chemotherapy or hormone (anti-estrogen) therapy.  Heavy smoking and excessive alcohol use.  Family history of early menopause. What are the signs or symptoms? Perimenopausal changes affect each woman differently. Symptoms of this condition may include:  Hot flashes.  Night sweats.  Irregular menstrual periods.  Decreased sex drive.  Vaginal dryness.  Headaches.  Mood swings.  Depression.  Memory problems or trouble concentrating.  Irritability.  Tiredness.  Weight gain.  Anxiety.  Trouble getting pregnant. How is this diagnosed? This condition is diagnosed based on your  medical history, a physical exam, your age, your menstrual history, and your symptoms. Hormone tests may also be done. How is this treated? In some cases, no treatment is needed. You and your health care provider should make a decision together about whether treatment is necessary. Treatment will be based on your individual condition and preferences. Various treatments are available, such as:  Menopausal hormone therapy (MHT).  Medicines to treat specific symptoms.  Acupuncture.  Vitamin or herbal supplements. Before starting treatment, make sure to let your health care provider know if you have a personal or family history of:  Heart disease.  Breast cancer.  Blood clots.  Diabetes.  Osteoporosis. Follow these instructions at home: Lifestyle  Do not use any products that contain nicotine or tobacco, such as cigarettes and e-cigarettes. If you need help quitting, ask your health care provider.  Eat a balanced diet that includes fresh fruits and vegetables, whole grains, soybeans, eggs, lean meat, and low-fat dairy.  Get at least 30 minutes of physical activity on 5 or more days each week.  Avoid alcoholic and caffeinated beverages, as well as spicy foods. This may help prevent hot flashes.  Get 7-8 hours of sleep each night.  Dress in layers that can be removed to help you manage hot flashes.  Find ways to manage stress, such as deep breathing, meditation, or journaling. General instructions  Keep track of your menstrual periods, including: ? When they occur. ? How heavy they are and how long they last. ? How much time passes between periods.  Keep track of your symptoms, noting when they start, how often you have  them, and how long they last.  Take over-the-counter and prescription medicines only as told by your health care provider.  Take vitamin supplements only as told by your health care provider. These may include calcium, vitamin E, and vitamin D.  Use  vaginal lubricants or moisturizers to help with vaginal dryness and improve comfort during sex.  Talk with your health care provider before starting any herbal supplements.  Keep all follow-up visits as told by your health care provider. This is important. This includes any group therapy or counseling. Contact a health care provider if:  You have heavy vaginal bleeding or pass blood clots.  Your period lasts more than 2 days longer than normal.  Your periods are recurring sooner than 21 days.  You bleed after having sex. Get help right away if:  You have chest pain, trouble breathing, or trouble talking.  You have severe depression.  You have pain when you urinate.  You have severe headaches.  You have vision problems. Summary  Perimenopause is the time when a woman's body begins to move into menopause. This may happen naturally or as a result of other health problems or medical treatments.  Perimenopause can begin 2-8 years before menopause, and it usually lasts for 1 year after menopause.  Perimenopausal symptoms can be managed through medicines, lifestyle changes, and complementary therapies such as acupuncture. This information is not intended to replace advice given to you by your health care provider. Make sure you discuss any questions you have with your health care provider. Document Released: 02/28/2004 Document Revised: 01/02/2017 Document Reviewed: 02/26/2016 Elsevier Patient Education  2020 [employer].  Vaginitis Vaginitis is a condition in which the vaginal tissue swells and becomes red (inflamed). This condition is most often caused by a change in the normal balance of bacteria and yeast that live in the vagina. This change causes an overgrowth of certain bacteria or yeast, which causes the inflammation. There are different types of vaginitis, but the most common types are:  Bacterial vaginosis.  Yeast infection (candidiasis).  Trichomoniasis vaginitis.  This is a sexually transmitted disease (STD).  Viral vaginitis.  Atrophic vaginitis.  Allergic vaginitis. What are the causes? The cause of this condition depends on the type of vaginitis. It can be caused by:  Bacteria (bacterial vaginosis).  Yeast, which is a fungus (yeast infection).  A parasite (trichomoniasis vaginitis).  A virus (viral vaginitis).  Low hormone levels (atrophic vaginitis). Low hormone levels can occur during pregnancy, breastfeeding, or after menopause.  Irritants, such as bubble baths, scented tampons, and feminine sprays (allergic vaginitis). Other factors can change the normal balance of the yeast and bacteria that live in the vagina. These include:  Antibiotic medicines.  Poor hygiene.  Diaphragms, vaginal sponges, spermicides, birth control pills, and intrauterine devices (IUD).  Sex.  Infection.  Uncontrolled diabetes.  A weakened defense (immune) system. What increases the risk? This condition is more likely to develop in women who:  Smoke.  Use vaginal douches, scented tampons, or scented sanitary pads.  Wear tight-fitting pants.  Wear thong underwear.  Use oral birth control pills or an IUD.  Have sex without a condom.  Have multiple sex partners.  Have an STD.  Frequently use the spermicide nonoxynol-9.  Eat lots of foods high in sugar.  Have uncontrolled diabetes.  Have low estrogen levels.  Have a weakened immune system from an immune disorder or medical treatment.  Are pregnant or breastfeeding. What are the signs or symptoms? Symptoms vary depending  on the cause of the vaginitis. Common symptoms include:  Abnormal vaginal discharge. ? The discharge is white, gray, or yellow with bacterial vaginosis. ? The discharge is thick, white, and cheesy with a yeast infection. ? The discharge is frothy and yellow or greenish with trichomoniasis.  A bad vaginal smell. The smell is fishy with bacterial  vaginosis.  Vaginal itching, pain, or swelling.  Sex that is painful.  Pain or burning when urinating. Sometimes there are no symptoms. How is this diagnosed? This condition is diagnosed based on your symptoms and medical history. A physical exam, including a pelvic exam, will also be done. You may also have other tests, including:  Tests to determine the pH level (acidity or alkalinity) of your vagina.  A whiff test, to assess the odor that results when a sample of your vaginal discharge is mixed with a potassium hydroxide solution.  Tests of vaginal fluid. A sample will be examined under a microscope. How is this treated? Treatment varies depending on the type of vaginitis you have. Your treatment may include:  Antibiotic creams or pills to treat bacterial vaginosis and trichomoniasis.  Antifungal medicines, such as vaginal creams or suppositories, to treat a yeast infection.  Medicine to ease discomfort if you have viral vaginitis. Your sexual partner should also be treated.  Estrogen delivered in a cream, pill, suppository, or vaginal ring to treat atrophic vaginitis. If vaginal dryness occurs, lubricants and moisturizing creams may help. You may need to avoid scented soaps, sprays, or douches.  Stopping use of a product that is causing allergic vaginitis. Then using a vaginal cream to treat the symptoms. Follow these instructions at home: Lifestyle  Keep your genital area clean and dry. Avoid soap, and only rinse the area with water.  Do not douche or use tampons until your health care provider says it is okay to do so. Use sanitary pads, if needed.  Do not have sex until your health care provider approves. When you can return to sex, practice safe sex and use condoms.  Wipe from front to back. This avoids the spread of bacteria from the rectum to the vagina. General instructions  Take over-the-counter and prescription medicines only as told by your health care  provider.  If you were prescribed an antibiotic medicine, take or use it as told by your health care provider. Do not stop taking or using the antibiotic even if you start to feel better.  Keep all follow-up visits as told by your health care provider. This is important. How is this prevented?  Use mild, non-scented products. Do not use things that can irritate the vagina, such as fabric softeners. Avoid the following products if they are scented: ? Feminine sprays. ? Detergents. ? Tampons. ? Feminine hygiene products. ? Soaps or bubble baths.  Let air reach your genital area. ? Wear cotton underwear to reduce moisture buildup. ? Avoid wearing underwear while you sleep. ? Avoid wearing tight pants and underwear or nylons without a cotton panel. ? Avoid wearing thong underwear.  Take off any wet clothing, such as bathing suits, as soon as possible.  Practice safe sex and use condoms. Contact a health care provider if:  You have abdominal pain.  You have a fever.  You have symptoms that last for more than 2-3 days. Get help right away if:  You have a fever and your symptoms suddenly get worse. Summary  Vaginitis is a condition in which the vaginal tissue becomes inflamed.This condition is most  often caused by a change in the normal balance of bacteria and yeast that live in the vagina.  Treatment varies depending on the type of vaginitis you have.  Do not douche, use tampons , or have sex until your health care provider approves. When you can return to sex, practice safe sex and use condoms. This information is not intended to replace advice given to you by your health care provider. Make sure you discuss any questions you have with your health care provider. Document Released: 11/17/2006 Document Revised: 01/02/2017 Document Reviewed: 02/26/2016 Elsevier Patient Education  2020 [employer].

## 2018-10-07 NOTE — Progress Notes (Signed)
GYNECOLOGY  VISIT   HPI: 48 y.o.   Married White or Caucasian Not Hispanic or Latino  female   [id number] with Patient's last menstrual period was 09/23/2018.   here for irregular bleeding with IUD that has been ongoing for 2 weeks. Reports vaginal odor without discharge.    She has a paragard IUD, placed in 11/16. She has been spotting for the last 2 weeks. Her last normal cycle was 5/25, then no bleeding until 2 weeks ago. Bleeding has just been light to spotting since then. She has noticed an odor.  Prior to May, she was having mostly monthly cycles. She was given provera x 1 in 3/20 for suspected anovulatory bleeding. Normal TSH in 12/19. No thyroid c/o other than weight gain. No galactorrhea. She has had less vasomotor symptoms in the last few months.   GYNECOLOGIC HISTORY: Patient's last menstrual period was 09/23/2018. Contraception: IUD Menopausal hormone therapy: None        OB History    Gravida  2   Para  0   Term  0   Preterm  0   AB  2   Living  0     SAB  0   TAB  2   Ectopic  0   Multiple  0   Live Births                 Patient Active Problem List   Diagnosis Date Noted  . Degenerative disc disease at L5-S1 level 08/04/2003    Past Medical History:  Diagnosis Date  . Chronic low back pain   . History of kidney stones    at age 20  . Sinusitis   . STD (sexually transmitted disease)    hx of HSVII since age 74    History reviewed. No pertinent surgical history.  Current Outpatient Medications  Medication Sig Dispense Refill  . loratadine (CLARITIN) 10 MG tablet Take 10 mg by mouth daily as needed for allergies.    Marland Kitchen PARAGARD INTRAUTERINE COPPER IU by Intrauterine route.    . valACYclovir (VALTREX) 500 MG tablet 1 tab po bid x 3 days prn 30 tablet 1   No current facility-administered medications for this visit.      ALLERGIES: Patient has no known allergies.  Family History  Problem Relation Age of Onset  . Heart disease Father   .  Other Father        Dec MVA due to poss heart attack at age 64  . Diabetes Father        AODM  . Hypertension Father   . Heart attack Father   . Heart attack Sister 91  . Hypertension Mother   . Heart disease Sister   . Stroke Maternal Grandmother   . Brain cancer Paternal Uncle   . Brain cancer Paternal Grandfather     Social History   Socioeconomic History  . Marital status: Married    Spouse name: Not on file  . Number of children: Not on file  . Years of education: Not on file  . Highest education level: Not on file  Occupational History  . Not on file  Social Needs  . Financial resource strain: Not on file  . Food insecurity    Worry: Not on file    Inability: Not on file  . Transportation needs    Medical: Not on file    Non-medical: Not on file  Tobacco Use  . Smoking status: Never Smoker  .  Smokeless tobacco: Never Used  Substance and Sexual Activity  . Alcohol use: Yes    Alcohol/week: 0.0 - 1.0 standard drinks  . Drug use: No  . Sexual activity: Yes    Partners: Male    Birth control/protection: I.U.D.  Lifestyle  . Physical activity    Days per week: Not on file    Minutes per session: Not on file  . Stress: Not on file  Relationships  . Social Musicianconnections    Talks on phone: Not on file    Gets together: Not on file    Attends religious service: Not on file    Active member of club or organization: Not on file    Attends meetings of clubs or organizations: Not on file    Relationship status: Not on file  . Intimate partner violence    Fear of current or ex partner: Not on file    Emotionally abused: Not on file    Physically abused: Not on file    Forced sexual activity: Not on file  Other Topics Concern  . Not on file  Social History Narrative  . Not on file    Review of Systems  Constitutional: Negative.   HENT: Negative.   Eyes: Negative.   Respiratory: Negative.   Cardiovascular: Negative.   Gastrointestinal: Negative.    Genitourinary:       Vaginal odor Irregular bleeding  Musculoskeletal: Negative.   Skin: Negative.   Neurological: Negative.   Endo/Heme/Allergies: Negative.   Psychiatric/Behavioral: Negative.     PHYSICAL EXAMINATION:    BP 120/78 (BP Location: Right Arm, Patient Position: Sitting, Cuff Size: Large)   Pulse 64   Temp (!) 97.1 F (36.2 C) (Skin)   Wt 186 lb 9.6 oz (84.6 kg)   LMP 09/23/2018 Comment: IUD  BMI 30.12 kg/m     General appearance: alert, cooperative and appears stated age Neck: no adenopathy, supple, symmetrical, trachea midline and thyroid normal to inspection and palpation  Pelvic: External genitalia:  no lesions              Urethra:  normal appearing urethra with no masses, tenderness or lesions              Bartholins and Skenes: normal                 Vagina: normal appearing vagina with normal color and discharge, no lesions              Cervix: no lesions and IUD strings 3-4 cm              Bimanual Exam:  Uterus:  normal size, contour, position, consistency, mobility, non-tender and anteverted              Adnexa: no masses, mildly tender on the left               The risks of endometrial biopsy were reviewed and a consent was obtained.  A speculum was placed in the vagina and the cervix was cleansed with betadine. A tenaculum was placed on the cervix and the mini-pipelle was placed into the endometrial cavity. The uterus sounded to 8 cm. The endometrial biopsy was performed, minimal tissue was obtained on 2 passes. The uterine explorer was used and moderate tissue was obtained. The tenaculum and speculum were removed. There were no complications.    Chaperone was present for exam.  Wet prep: + clue, no trich, few wbc KOH: no yeast PH: 5  ASSESSMENT AUB, suspect anovulatory bleed Paragard for contraception BV    PLAN UPT negative Endometrial biopsy done Treat with provera 5 mg cyclically as needed Discussed the mirena IUD as another option  for contraception   An After Visit Summary was printed and given to the patient.  Over 25 minutes face to face time of which over 50% was spent in counseling.

## 2018-11-30 ENCOUNTER — Other Ambulatory Visit: Payer: Self-pay

## 2018-11-30 ENCOUNTER — Ambulatory Visit (INDEPENDENT_AMBULATORY_CARE_PROVIDER_SITE_OTHER): Payer: 59 | Admitting: Medical

## 2018-11-30 ENCOUNTER — Encounter: Payer: Self-pay | Admitting: Medical

## 2018-11-30 VITALS — BP 132/94 | HR 69 | Temp 97.9°F

## 2018-11-30 DIAGNOSIS — J309 Allergic rhinitis, unspecified: Secondary | ICD-10-CM | POA: Diagnosis not present

## 2018-11-30 DIAGNOSIS — J029 Acute pharyngitis, unspecified: Secondary | ICD-10-CM | POA: Diagnosis not present

## 2018-11-30 MED ORDER — FLUTICASONE PROPIONATE 50 MCG/ACT NA SUSP: 2.0000 | Freq: Every day | NASAL | 1 refills | Status: DC

## 2018-11-30 MED ORDER — AZITHROMYCIN 250 MG PO TABS: ORAL_TABLET | ORAL | 0 refills | Status: DC

## 2018-11-30 NOTE — Progress Notes (Signed)
   Subjective:    Patient ID: Lauren Randolph, female    DOB: [birth year], 48 y.o.   MRN: [id number]  HPI  Virtual Visit via Video Note  I connected with Lauren Randolph on 11/30/18 at  2:20 PM EDT by a video enabled telemedicine application and verified that I am speaking with the correct person using two identifiers.  Location: Patient: home Provider: office   I discussed the limitations of evaluation and management by telemedicine and the availability of in person appointments. The patient expressed understanding and agreed to proceed.  History of Present Illness: Pt states on Sunday felt fatigued, ache and yesterday developed st.  Pt has some mild pnd recently as well. Pt is on claritin.  Pt state yesterday opened mouth and saw     Observations/Objective:  General-no acute distress, pleasant, oriented. Lungs- on inspection lungs appear unlabored. Neck- no tracheal deviation or jvd on inspection. Pt thinks left submandibular node swollen. Neuro- gross motor function appears intact. Throat- by video pinkish/bright red appearance. +pnd seen.  Assessment and Plan: You do describe severe soar throat and by video exam throat has appearance of possible strep. Will go ahead and rx azithromycin antibiotic and see how you respond clinically.  You also have some allergy history and by exam some pnd. Continue claritin and rx flonase.  Recommend stay at home pending clinical response to tx. Update me by my chart on Saturday. If not improved then would recommend getting covid test through drive thru center on Monday.  Follow up date to be determined.  Follow Up Instructions:    I discussed the assessment and treatment plan with the patient. The patient was provided an opportunity to ask questions and all were answered. The patient agreed with the plan and demonstrated an understanding of the instructions.   The patient was advised to call back or seek an in-person evaluation if the  symptoms worsen or if the condition fails to improve as anticipated.  I provided 25 minutes of non-face-to-face time during this encounter.   Mackie Pai, PA-C   Review of Systems  Constitutional: Positive for fatigue. Negative for chills and fever.  HENT: Positive for congestion and postnasal drip. Negative for sinus pressure and sinus pain.   Respiratory: Negative for cough, chest tightness and wheezing.   Cardiovascular: Negative for chest pain and palpitations.  Gastrointestinal: Negative for abdominal pain.  Musculoskeletal: Positive for myalgias.  Hematological: Positive for adenopathy. Does not bruise/bleed easily.       Objective:   Physical Exam        Assessment & Plan:

## 2018-11-30 NOTE — Progress Notes (Deleted)
[place] [facility] [address] [place], [place] [address] [phone]  Date:  12/01/2018   Name:  Kanaya Eldred   DOB:  11-01-1970   MRN:  [id number]  PCP:  Darreld Mclean, MD    Chief Complaint: No chief complaint on file.   History of Present Illness:  Halynn Linney is a 48 y.o. very pleasant female patient who presents with the following:  Generally healthy woman here today with concern of a swollen gland and sore throat and  She sees Dr. Talbert Nan for her GYN care Last seen by myself in January for foot pain  Patient Active Problem List   Diagnosis Date Noted  . Degenerative disc disease at L5-S1 level 08/04/2003    Past Medical History:  Diagnosis Date  . Chronic low back pain   . History of kidney stones    at age 29  . Sinusitis   . STD (sexually transmitted disease)    hx of HSVII since age 59    No past surgical history on file.  Social History   Tobacco Use  . Smoking status: Never Smoker  . Smokeless tobacco: Never Used  Substance Use Topics  . Alcohol use: Yes    Alcohol/week: 0.0 - 1.0 standard drinks  . Drug use: No    Family History  Problem Relation Age of Onset  . Heart disease Father   . Other Father        Dec MVA due to poss heart attack at age 49  . Diabetes Father        AODM  . Hypertension Father   . Heart attack Father   . Heart attack Sister 12  . Hypertension Mother   . Heart disease Sister   . Stroke Maternal Grandmother   . Brain cancer Paternal Uncle   . Brain cancer Paternal Grandfather     No Known Allergies  Medication list has been reviewed and updated.  Current Outpatient Medications on File Prior to Visit  Medication Sig Dispense Refill  . loratadine (CLARITIN) 10 MG tablet Take 10 mg by mouth daily as needed for allergies.    . medroxyPROGESTERone (PROVERA) 5 MG tablet Take one tablet daily x 5 days now and every 2 months if no spontaneous  menses. 15 tablet 1  . metroNIDAZOLE (FLAGYL) 500 MG tablet Take 1 tablet (500 mg total) by mouth 2 (two) times daily. 14 tablet 0  . PARAGARD INTRAUTERINE COPPER IU by Intrauterine route.    . valACYclovir (VALTREX) 500 MG tablet 1 tab po bid x 3 days prn 30 tablet 1   No current facility-administered medications on file prior to visit.     Review of Systems:  As per HPI- otherwise negative.   Physical Examination: There were no vitals filed for this visit. There were no vitals filed for this visit. There is no height or weight on file to calculate BMI. Ideal Body Weight:    GEN: WDWN, NAD, Non-toxic, A & O x 3 HEENT: Atraumatic, Normocephalic. Neck supple. No masses, No LAD. Ears and Nose: No external deformity. CV: RRR, No M/G/R. No JVD. No thrill. No extra heart sounds. PULM: CTA B, no wheezes, crackles, rhonchi. No retractions. No resp. distress. No accessory muscle use. ABD: S, NT, ND, +BS. No rebound. No HSM. EXTR: No c/c/e NEURO Normal gait.  PSYCH: Normally interactive. Conversant. Not depressed or anxious appearing.  Calm demeanor.    Assessment  and Plan: ***  Signed Lamar Blinks, MD

## 2018-11-30 NOTE — Patient Instructions (Signed)
You do describe severe soar throat and by video exam throat has appearance of possible strep. Will go ahead and rx azithromycin antibiotic and see how you respond clinically.  You also have some allergy history and by exam some pnd. Continue claritin and rx flonase.  Recommend stay at home pending clinical response to tx. Update me by my chart on Saturday. If not improved then would recommend getting covid test through drive thru center on Monday.  Follow up date to be determined.

## 2018-12-01 ENCOUNTER — Ambulatory Visit: Payer: 59 | Admitting: Family Medicine

## 2018-12-03 ENCOUNTER — Encounter: Payer: Self-pay | Admitting: Medical

## 2018-12-08 ENCOUNTER — Ambulatory Visit (INDEPENDENT_AMBULATORY_CARE_PROVIDER_SITE_OTHER): Payer: 59 | Admitting: Family Medicine

## 2018-12-08 ENCOUNTER — Ambulatory Visit: Payer: Self-pay

## 2018-12-08 ENCOUNTER — Other Ambulatory Visit: Payer: Self-pay

## 2018-12-08 ENCOUNTER — Encounter: Payer: Self-pay | Admitting: Family Medicine

## 2018-12-08 VITALS — BP 141/85 | HR 74 | Ht 65.0 in | Wt 180.0 lb

## 2018-12-08 DIAGNOSIS — M7501 Adhesive capsulitis of right shoulder: Secondary | ICD-10-CM | POA: Insufficient documentation

## 2018-12-08 DIAGNOSIS — G8929 Other chronic pain: Secondary | ICD-10-CM | POA: Diagnosis not present

## 2018-12-08 DIAGNOSIS — M25511 Pain in right shoulder: Secondary | ICD-10-CM

## 2018-12-08 MED ORDER — IBUPROFEN-FAMOTIDINE 800-26.6 MG PO TABS: 1.0000 | ORAL_TABLET | Freq: Three times a day (TID) | ORAL | 3 refills | Status: AC

## 2018-12-08 NOTE — Progress Notes (Signed)
Medication Samples have been provided to the patient.  Drug name: Duexis       Strength: 800mg /26.6mg         Qty: 1 Box  LOT: [id number]  Exp.Date: 01/2020  Dosing instructions: Take 1 tablet by mouth three (3) a day.  The patient has been instructed regarding the correct time, dose, and frequency of taking this medication, including desired effects and most common side effects.   Sherrie George, MA 2:13 PM 12/08/2018

## 2018-12-08 NOTE — Patient Instructions (Signed)
Nice to meet you  Happy early Rudene Anda.  Please try heat before the exercises and ice after  Please take the duexis as needed  Please avoid movements where the shoulder is out away from the body Please send me a message in MyChart with any questions or updates.  Please see me back in 4-6 weeks.   --Dr. Raeford Razor

## 2018-12-08 NOTE — Progress Notes (Signed)
Lauren Randolph - 48 y.o. female MRN [id number]  Date of birth: Jun 28, 1970  SUBJECTIVE:  Including CC & ROS.  Chief Complaint  Patient presents with  . Shoulder Pain    right shoulder    [name] is a 48 y.o. female that is presenting with acute on chronic right shoulder pain.  She initially felt the pain in February or March when she was performing a yoga pose.  Since that time the pain has been intermittent in nature.  She feels it when she is externally rotated and abducted.  She also has pain when she reaches behind her back.  She has taken ibuprofen admittedly for the pain.  It is a soreness and dullness type of pain.  It is mild and intermittent.  She does feel some pain around the elbow at times.  Denies any history of surgery or neck surgery..   Review of Systems  Constitutional: Negative for fever.  HENT: Negative for congestion.   Respiratory: Negative for cough.   Cardiovascular: Negative for chest pain.  Gastrointestinal: Negative for abdominal pain.  Musculoskeletal: Negative for back pain.  Skin: Negative for color change.  Neurological: Negative for weakness.  Hematological: Negative for adenopathy.    HISTORY: Past Medical, Surgical, Social, and Family History Reviewed & Updated per EMR.   Pertinent Historical Findings include:  Past Medical History:  Diagnosis Date  . Chronic low back pain   . History of kidney stones    at age 47  . Sinusitis   . STD (sexually transmitted disease)    hx of HSVII since age 48    No past surgical history on file.  No Known Allergies  Family History  Problem Relation Age of Onset  . Heart disease Father   . Other Father        Dec MVA due to poss heart attack at age 48  . Diabetes Father        AODM  . Hypertension Father   . Heart attack Father   . Heart attack Sister 44  . Hypertension Mother   . Heart disease Sister   . Stroke Maternal Grandmother   . Brain cancer Paternal Uncle   . Brain cancer Paternal  Grandfather      Social History   Socioeconomic History  . Marital status: Married    Spouse name: Not on file  . Number of children: Not on file  . Years of education: Not on file  . Highest education level: Not on file  Occupational History  . Not on file  Social Needs  . Financial resource strain: Not on file  . Food insecurity    Worry: Not on file    Inability: Not on file  . Transportation needs    Medical: Not on file    Non-medical: Not on file  Tobacco Use  . Smoking status: Never Smoker  . Smokeless tobacco: Never Used  Substance and Sexual Activity  . Alcohol use: Yes    Alcohol/week: 0.0 - 1.0 standard drinks  . Drug use: No  . Sexual activity: Yes    Partners: Male    Birth control/protection: I.U.D.  Lifestyle  . Physical activity    Days per week: Not on file    Minutes per session: Not on file  . Stress: Not on file  Relationships  . Social Musician on phone: Not on file    Gets together: Not on file    Attends religious service:  Not on file    Active member of club or organization: Not on file    Attends meetings of clubs or organizations: Not on file    Relationship status: Not on file  . Intimate partner violence    Fear of current or ex partner: Not on file    Emotionally abused: Not on file    Physically abused: Not on file    Forced sexual activity: Not on file  Other Topics Concern  . Not on file  Social History Narrative  . Not on file     PHYSICAL EXAM:  VS: BP (!) 141/85   Pulse 74   Ht 5\' 5"  (1.651 m)   Wt 180 lb (81.6 kg)   BMI 29.95 kg/m  Physical Exam Gen: NAD, alert, cooperative with exam, well-appearing ENT: normal lips, normal nasal mucosa,  Eye: normal EOM, normal conjunctiva and lids CV:  no edema, +2 pedal pulses   Resp: no accessory muscle use, non-labored,   Skin: no rashes, no areas of induration  Neuro: normal tone, normal sensation to touch Psych:  normal insight, alert and oriented MSK:   Right shoulder: Normal active flexion and abduction. External rotation appears to be more limited compared to the contralateral side. Pain with external rotation and abduction. Mild pain with empty can testing. Normal speeds test. Negative O'Brien's test. Neurovascularly intact  Limited ultrasound: Right shoulder:  Normal-appearing biceps tendon short and long axis. Normal-appearing subscapularis. Supraspinatus appears to be homogeneous with no hyperemia.  There is a possible impingement with dynamic testing. No significant effusion and static views of the posterior glenohumeral joint.  There may be more of an effusion noted in dynamic testing. Axillary views were near identical on the left and right.  Summary: Findings would suggest more of a capsulitis such as a frozen shoulder.  Ultrasound and interpretation by Clearance Coots, MD    ASSESSMENT & PLAN:   Chronic right shoulder pain Pain is been ongoing since February or March.  She feels as though she hurt it initially when she was performing externally rotated move and yoga.  No significant findings on ultrasound to suggest rotator cuff.  Seems more of a adhesive capsulitis but with some maintained external rotation. -Counseled on home exercise therapy and supportive care. -Provided Duexis sample and sent prescription in. -If no improvement can consider physical therapy, injection or imaging.

## 2018-12-08 NOTE — Assessment & Plan Note (Signed)
Pain is been ongoing since February or March.  She feels as though she hurt it initially when she was performing externally rotated move and yoga.  No significant findings on ultrasound to suggest rotator cuff.  Seems more of a adhesive capsulitis but with some maintained external rotation. -Counseled on home exercise therapy and supportive care. -Provided Duexis sample and sent prescription in. -If no improvement can consider physical therapy, injection or imaging.

## 2018-12-09 ENCOUNTER — Other Ambulatory Visit: Payer: Self-pay

## 2018-12-09 ENCOUNTER — Ambulatory Visit (INDEPENDENT_AMBULATORY_CARE_PROVIDER_SITE_OTHER): Payer: 59

## 2018-12-09 DIAGNOSIS — Z23 Encounter for immunization: Secondary | ICD-10-CM

## 2019-01-19 ENCOUNTER — Ambulatory Visit (INDEPENDENT_AMBULATORY_CARE_PROVIDER_SITE_OTHER): Payer: 59 | Admitting: Family Medicine

## 2019-01-19 ENCOUNTER — Encounter: Payer: Self-pay | Admitting: Family Medicine

## 2019-01-19 ENCOUNTER — Other Ambulatory Visit: Payer: Self-pay

## 2019-01-19 ENCOUNTER — Ambulatory Visit: Payer: Self-pay

## 2019-01-19 ENCOUNTER — Ambulatory Visit (HOSPITAL_BASED_OUTPATIENT_CLINIC_OR_DEPARTMENT_OTHER)
Admission: RE | Admit: 2019-01-19 | Discharge: 2019-01-19 | Disposition: A | Discharge status: Home / Self Care | Payer: 59 | Source: Ambulatory Visit | Attending: Family Medicine | Admitting: Family Medicine

## 2019-01-19 VITALS — BP 135/85 | HR 57 | Ht 66.0 in | Wt 185.0 lb

## 2019-01-19 DIAGNOSIS — M7501 Adhesive capsulitis of right shoulder: Secondary | ICD-10-CM

## 2019-01-19 MED ORDER — TRIAMCINOLONE ACETONIDE 40 MG/ML IJ SUSP
40.0000 mg | Freq: Once | INTRAMUSCULAR | Status: AC
  Administered 2019-01-19: 14:00:00 40 mg via INTRA_ARTICULAR

## 2019-01-19 NOTE — Patient Instructions (Signed)
Good to see you Please try heat before exercise and ice after  Please try the exercises  I will call with the xray results.  Please send me a message in MyChart with any questions or updates.  Please see me back in 4-6 weeks.   --Dr. Raeford Razor

## 2019-01-19 NOTE — Progress Notes (Signed)
Lauren Randolph - 48 y.o. female MRN [id number]  Date of birth: August 05, 1970  SUBJECTIVE:  Including CC & ROS.  Chief Complaint  Patient presents with  . Follow-up    follow up for right shoulder    Lauren Randolph is a 48 y.o. female that is presenting with acute on chronic right shoulder pain.  She had limited improvement with all modalities and medication thus far.  Seems to have further restriction of her range of motion.  Pain is moderate to severe.  Is intermittent in nature.  It is worse with lying on the affected side and in the morning.  She is able to get it to move more through the course the day but resets itself at night.  Has tried medications with limited improvement.  Seems to be worse with work as well.  Has pain at the shoulder as well as some radiating symptoms down to the elbow.  It is an ache and throbbing.   Review of Systems  Constitutional: Negative for fever.  HENT: Negative for congestion.   Respiratory: Negative for cough.   Cardiovascular: Negative for chest pain.  Gastrointestinal: Negative for abdominal pain.  Musculoskeletal: Negative for gait problem.  Skin: Negative for color change.  Neurological: Negative for weakness.  Hematological: Negative for adenopathy.    HISTORY: Past Medical, Surgical, Social, and Family History Reviewed & Updated per EMR.   Pertinent Historical Findings include:  Past Medical History:  Diagnosis Date  . Chronic low back pain   . History of kidney stones    at age 21  . Sinusitis   . STD (sexually transmitted disease)    hx of HSVII since age 48    No past surgical history on file.  No Known Allergies  Family History  Problem Relation Age of Onset  . Heart disease Father   . Other Father        Dec MVA due to poss heart attack at age 56  . Diabetes Father        AODM  . Hypertension Father   . Heart attack Father   . Heart attack Sister 56  . Hypertension Mother   . Heart disease Sister   . Stroke Maternal  Grandmother   . Brain cancer Paternal Uncle   . Brain cancer Paternal Grandfather      Social History   Socioeconomic History  . Marital status: Married    Spouse name: Not on file  . Number of children: Not on file  . Years of education: Not on file  . Highest education level: Not on file  Occupational History  . Not on file  Tobacco Use  . Smoking status: Never Smoker  . Smokeless tobacco: Never Used  Substance and Sexual Activity  . Alcohol use: Yes    Alcohol/week: 0.0 - 1.0 standard drinks  . Drug use: No  . Sexual activity: Yes    Partners: Male    Birth control/protection: I.U.D.  Other Topics Concern  . Not on file  Social History Narrative  . Not on file   Social Determinants of Health   Financial Resource Strain:   . Difficulty of Paying Living Expenses: Not on file  Food Insecurity:   . Worried About Charity fundraiser in the Last Year: Not on file  . Ran Out of Food in the Last Year: Not on file  Transportation Needs:   . Lack of Transportation (Medical): Not on file  . Lack of Transportation (Non-Medical): Not  on file  Physical Activity:   . Days of Exercise per Week: Not on file  . Minutes of Exercise per Session: Not on file  Stress:   . Feeling of Stress : Not on file  Social Connections:   . Frequency of Communication with Friends and Family: Not on file  . Frequency of Social Gatherings with Friends and Family: Not on file  . Attends Religious Services: Not on file  . Active Member of Clubs or Organizations: Not on file  . Attends Banker Meetings: Not on file  . Marital Status: Not on file  Intimate Partner Violence:   . Fear of Current or Ex-Partner: Not on file  . Emotionally Abused: Not on file  . Physically Abused: Not on file  . Sexually Abused: Not on file     PHYSICAL EXAM:  VS: BP 135/85   Pulse (!) 57   Ht 5\' 6"  (1.676 m)   Wt 185 lb (83.9 kg)   BMI 29.86 kg/m  Physical Exam Gen: NAD, alert, cooperative  with exam, well-appearing ENT: normal lips, normal nasal mucosa,  Eye: normal EOM, normal conjunctiva and lids CV:  no edema, +2 pedal pulses   Resp: no accessory muscle use, non-labored,  GI: no masses or tenderness, no hernia  Skin: no rashes, no areas of induration  Neuro: normal tone, normal sensation to touch Psych:  normal insight, alert and oriented MSK:  Right shoulder: Limited flexion and abduction. Limited external rotation. Normal strength resistance. Normal empty can test. Neurovascularly intact   Aspiration/Injection Procedure Note Bridey Dacy 05/11/1970  Procedure: Injection Indications: Right shoulder pain  Procedure Details Consent: Risks of procedure as well as the alternatives and risks of each were explained to the (patient/caregiver).  Consent for procedure obtained. Time Out: Verified patient identification, verified procedure, site/side was marked, verified correct patient position, special equipment/implants available, medications/allergies/relevent history reviewed, required imaging and test results available.  Performed.  The area was cleaned with iodine and alcohol swabs.    The right glenohumeral joint was injected using 3 cc of 1% lidocaine, 1 cc's of 40 mg Kenalog and 3 cc's of 0.25% bupivacaine with a 22 2" needle.  Ultrasound was used. Images were obtained in short views showing the injection.     A sterile dressing was applied.  Patient did tolerate procedure well.      ASSESSMENT & PLAN:   Adhesive capsulitis of right shoulder More limited in her range of motion than previously.  Exam more suggestive of frozen shoulder today. -Glenohumeral injection. -Counseled on home exercise therapy and supportive care. -X-ray. -Could consider physical therapy, further imaging or Toradol injection.

## 2019-01-19 NOTE — Assessment & Plan Note (Signed)
More limited in her range of motion than previously.  Exam more suggestive of frozen shoulder today. -Glenohumeral injection. -Counseled on home exercise therapy and supportive care. -X-ray. -Could consider physical therapy, further imaging or Toradol injection.

## 2019-01-20 ENCOUNTER — Encounter: Payer: Self-pay | Admitting: Obstetrics and Gynecology

## 2019-01-20 ENCOUNTER — Telehealth: Payer: Self-pay | Admitting: Family Medicine

## 2019-01-20 ENCOUNTER — Telehealth: Payer: Self-pay | Admitting: Obstetrics and Gynecology

## 2019-01-20 DIAGNOSIS — M7501 Adhesive capsulitis of right shoulder: Secondary | ICD-10-CM

## 2019-01-20 NOTE — Telephone Encounter (Signed)
Patient sent the following message through [facility]. Routing to triage to assist patient with request.  Viger, Kyle  P [facility]  Phone Number: [phone]  [facility] Dr. Lenna Sciara.,  I have been seeing Dr. Raeford Razor (sports medicine) who diagnosed me with Frozen Shoulder. This has been coming on for some time but got worse in the last 6 weeks. I had and injection yesterday which has eased the pain some so that I can exercise it. My question to you is really around root cause. I read some comments about this and some folks believe that frozen shoulder is hormone related as it usually hits women age 56 to 50. There was mention of thyroid and sugar levels being an issue as well. I am wondering if you have any experience with this or have researched this connection and if you know of any supplelemts that may help in speeding up the recovery process. I have an appointment with you Jan 6th for my annual exam, so we can get all of my blood tests done. I am curious to know if there have been changes in the thyroid and sugar levels that may be related to this issue.  Thanks and I'll see you in a few weeks.  -Lauren Randolph

## 2019-01-20 NOTE — Telephone Encounter (Signed)
Routing MyChart message to Dr. Jertson to review.  

## 2019-01-20 NOTE — Telephone Encounter (Signed)
Informed of results. Pain has been occurring since [place]. Has tried conservative measures. MRi to evaluate for labral pathology, degenerative changes, or capsulitis.   Rosemarie Ax, MD [facility] 01/20/2019, 8:58 AM

## 2019-01-22 ENCOUNTER — Ambulatory Visit
Admission: RE | Admit: 2019-01-22 | Discharge: 2019-01-22 | Disposition: A | Discharge status: Home / Self Care | Payer: 59 | Source: Ambulatory Visit | Attending: Family Medicine | Admitting: Family Medicine

## 2019-01-22 ENCOUNTER — Other Ambulatory Visit: Payer: Self-pay

## 2019-01-22 DIAGNOSIS — M7501 Adhesive capsulitis of right shoulder: Secondary | ICD-10-CM

## 2019-01-24 ENCOUNTER — Telehealth (INDEPENDENT_AMBULATORY_CARE_PROVIDER_SITE_OTHER): Payer: 59 | Admitting: Family Medicine

## 2019-01-24 DIAGNOSIS — M7501 Adhesive capsulitis of right shoulder: Secondary | ICD-10-CM | POA: Diagnosis not present

## 2019-01-24 NOTE — Assessment & Plan Note (Signed)
Has had improvement of her symptoms since steroid injection.  MRI showing signs of capsulitis. -Counseled on home exercise therapy and supportive care. -Could consider Toradol injection or physical therapy. -Could consider muscle relaxer if needed.

## 2019-01-24 NOTE — Progress Notes (Signed)
Virtual Visit via Video Note  I connected with Lauren Randolph on 01/24/19 at  2:30 PM EST by a video enabled telemedicine application and verified that I am speaking with the correct person using two identifiers.   I discussed the limitations of evaluation and management by telemedicine and the availability of in person appointments. The patient expressed understanding and agreed to proceed.  History of Present Illness:  Ms. Lauren Randolph is a 48 year old female that is following up for her right shoulder pain.   She recently had a glenohumeral injection and has had improvement of her pain and function.  MRI was recently resulted that showed findings suggestive of adhesive capsulitis.  Did show a minimal tear of the infraspinatus.  She is able to do most things and so only notices pain occasionally.  Observations/Objective:  Gen: NAD, alert, cooperative with exam, well-appearing ENT: normal lips, normal nasal mucosa,  Eye: normal EOM, normal conjunctiva and lids Resp: no accessory muscle use, non-labored,  Psych:  normal insight, alert and oriented   Assessment and Plan:  Adhesive capsulitis of right shoulder: Has had improvement of her symptoms since steroid injection.  MRI showing signs of capsulitis. -Counseled on home exercise therapy and supportive care. -Could consider Toradol injection or physical therapy. -Could consider muscle relaxer if needed.   Follow Up Instructions:    I discussed the assessment and treatment plan with the patient. The patient was provided an opportunity to ask questions and all were answered. The patient agreed with the plan and demonstrated an understanding of the instructions.   The patient was advised to call back or seek an in-person evaluation if the symptoms worsen or if the condition fails to improve as anticipated.   Clearance Coots, MD

## 2019-01-25 ENCOUNTER — Other Ambulatory Visit: Payer: Self-pay

## 2019-01-29 ENCOUNTER — Other Ambulatory Visit: Payer: 59

## 2019-02-09 ENCOUNTER — Ambulatory Visit: Payer: 59 | Admitting: Obstetrics and Gynecology

## 2019-02-28 NOTE — Progress Notes (Signed)
49 y.o. G35P0020 Married White or Caucasian Not Hispanic or Latino female here for annual exam.  Patient states that there is nothing she would not consider normal going on today. She has a paragard IUD, placed in 11/16. Last fall she was seen with AUB, biopsy was benign. She was given cyclic provera to take as needed. She hasn't needed it. Cycles have been every 4-8 weeks. One time she had a cycle in 2.5 weeks (after the 8 week cycle). She has been using a diva cup (advised not to). She can saturate a super tampon in 4 hours. No dyspareunia.   Period Duration (Days): 7 Period Pattern: (!) Irregular Menstrual Flow: Moderate Menstrual Control: Other (Comment)(Diva Cup) Dysmenorrhea: (!) Mild Dysmenorrhea Symptoms: Cramping  Patient's last menstrual period was 02/25/2019.          Sexually active: Yes.    The current method of family planning is IUD.    Exercising: Yes.    Spin class, yoga  Smoker:  no  Health Maintenance: Pap:  01/21/18 WNL NEG HR HPV, 12-07-14 WNL NEG HR HPV History of abnormal Pap:  no MMG:  09/14/18 Density C Bi-rads 1 neg  BMD:   Never  Colonoscopy: never  TDaP:  12/07/14  Gardasil: NA   reports that she has never smoked. She has never used smokeless tobacco. She reports current alcohol use. She reports that she does not use drugs. Drinks a glass of wine a week. Husband is in [place]. 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   Past Medical History:  Diagnosis Date  . Chronic low back pain   . History of kidney stones    at age 57  . Sinusitis   . STD (sexually transmitted disease)    hx of HSVII since age 72    History reviewed. No pertinent surgical history.  Current Outpatient Medications  Medication Sig Dispense Refill  . Ibuprofen-Famotidine 800-26.6 MG TABS Take 1 tablet by mouth 3 (three) times daily. 90 tablet 3  . loratadine (CLARITIN) 10 MG tablet Take 10 mg by mouth daily as needed for allergies.    . medroxyPROGESTERone (PROVERA) 5 MG  tablet Take one tablet daily x 5 days now and every 2 months if no spontaneous menses. 15 tablet 1  . PARAGARD INTRAUTERINE COPPER IU by Intrauterine route.    . valACYclovir (VALTREX) 500 MG tablet 1 tab po bid x 3 days prn 30 tablet 1   No current facility-administered medications for this visit.    Family History  Problem Relation Age of Onset  . Heart disease Father   . Other Father        Dec MVA due to poss heart attack at age 16  . Diabetes Father        AODM  . Hypertension Father   . Heart attack Father   . Heart attack Sister 64  . Hypertension Mother   . Heart disease Sister   . Stroke Maternal Grandmother   . Brain cancer Paternal Uncle   . Brain cancer Paternal Grandfather     Review of Systems  All other systems reviewed and are negative.   Exam:   BP 110/68   Pulse (!) 54   Temp (!) 97.5 F (36.4 C)   Ht 5\' 6"  (1.676 m)   Wt 184 lb (83.5 kg)   LMP 02/25/2019   SpO2 97%   BMI 29.70 kg/m   Weight change: @WEIGHTCHANGE @ Height:   Height: 5\' 6"  (167.6  cm)  Ht Readings from Last 3 Encounters:  03/03/19 5\' 6"  (1.676 m)  03/02/19 5\' 6"  (1.676 m)  01/19/19 5\' 6"  (1.676 m)    General appearance: alert, cooperative and appears stated age Head: Normocephalic, without obvious abnormality, atraumatic Neck: no adenopathy, supple, symmetrical, trachea midline and thyroid normal to inspection and palpation Lungs: clear to auscultation bilaterally Cardiovascular: regular rate and rhythm Breasts: in the right breast at 10 o'clock is a stable 4 x 1.5 cm, smooth, mobile lump. No other lumps, no skin changes, no nipple retraction in either breast Abdomen: soft, non-tender; non distended,  no masses,  no organomegaly Extremities: extremities normal, atraumatic, no cyanosis or edema Skin: Skin color, texture, turgor normal. No rashes or lesions Lymph nodes: Cervical, supraclavicular, and axillary nodes normal. No abnormal inguinal nodes palpated Neurologic: Grossly  normal   Pelvic: External genitalia:  no lesions              Urethra:  normal appearing urethra with no masses, tenderness or lesions              Bartholins and Skenes: normal                 Vagina: normal appearing vagina with a thick white vaginal d/c              Cervix: no lesions and IUD string 2 cm               Bimanual Exam:  Uterus:  normal size, contour, position, consistency, mobility, non-tender              Adnexa: no mass, fullness, tenderness               Rectovaginal: Confirms               Anus:  normal sphincter tone, no lesions  Gae Dry chaperoned for the exam.  A:  Well Woman with normal exam  IUD check  Some irregular perimenopausal bleeding, negative biopsy last fall  Stable right breast lump, negative evaluation  Vaginal d/c on exam, would not treat if not symptomatic, patient would like testing in case symptoms start   P:   No pap this year  Discussed breast self exam  Discussed calcium and vit D intake  Screening labs  Cyclic provera, will take monthly if no spontaneous cycles.   Affirm

## 2019-03-02 ENCOUNTER — Encounter: Payer: Self-pay | Admitting: Family Medicine

## 2019-03-02 ENCOUNTER — Ambulatory Visit (INDEPENDENT_AMBULATORY_CARE_PROVIDER_SITE_OTHER): Payer: 59 | Admitting: Family Medicine

## 2019-03-02 ENCOUNTER — Other Ambulatory Visit: Payer: Self-pay

## 2019-03-02 DIAGNOSIS — M7501 Adhesive capsulitis of right shoulder: Secondary | ICD-10-CM

## 2019-03-02 NOTE — Progress Notes (Signed)
Lauren Randolph - 49 y.o. female MRN [id number]  Date of birth: 10-May-1970  SUBJECTIVE:  Including CC & ROS.  Chief Complaint  Patient presents with  . Follow-up    follow up for right shoulder    [name] is a 49 y.o. female that is following up for her right shoulder pain.  Has been doing well since the injection.  Is getting back to more for normal workouts.  The range of motion is still not complete but the pain is improved.   Review of Systems See HPI   HISTORY: Past Medical, Surgical, Social, and Family History Reviewed & Updated per EMR.   Pertinent Historical Findings include:  Past Medical History:  Diagnosis Date  . Chronic low back pain   . History of kidney stones    at age 26  . Sinusitis   . STD (sexually transmitted disease)    hx of HSVII since age 39    No past surgical history on file.  No Known Allergies  Family History  Problem Relation Age of Onset  . Heart disease Father   . Other Father        Dec MVA due to poss heart attack at age 96  . Diabetes Father        AODM  . Hypertension Father   . Heart attack Father   . Heart attack Sister 4  . Hypertension Mother   . Heart disease Sister   . Stroke Maternal Grandmother   . Brain cancer Paternal Uncle   . Brain cancer Paternal Grandfather      Social History   Socioeconomic History  . Marital status: Married    Spouse name: Not on file  . Number of children: Not on file  . Years of education: Not on file  . Highest education level: Not on file  Occupational History  . Not on file  Tobacco Use  . Smoking status: Never Smoker  . Smokeless tobacco: Never Used  Substance and Sexual Activity  . Alcohol use: Yes    Alcohol/week: 0.0 - 1.0 standard drinks  . Drug use: No  . Sexual activity: Yes    Partners: Male    Birth control/protection: I.U.D.  Other Topics Concern  . Not on file  Social History Narrative  . Not on file   Social Determinants of Health   Financial  Resource Strain:   . Difficulty of Paying Living Expenses: Not on file  Food Insecurity:   . Worried About Charity fundraiser in the Last Year: Not on file  . Ran Out of Food in the Last Year: Not on file  Transportation Needs:   . Lack of Transportation (Medical): Not on file  . Lack of Transportation (Non-Medical): Not on file  Physical Activity:   . Days of Exercise per Week: Not on file  . Minutes of Exercise per Session: Not on file  Stress:   . Feeling of Stress : Not on file  Social Connections:   . Frequency of Communication with Friends and Family: Not on file  . Frequency of Social Gatherings with Friends and Family: Not on file  . Attends Religious Services: Not on file  . Active Member of Clubs or Organizations: Not on file  . Attends Archivist Meetings: Not on file  . Marital Status: Not on file  Intimate Partner Violence:   . Fear of Current or Ex-Partner: Not on file  . Emotionally Abused: Not on file  .  Physically Abused: Not on file  . Sexually Abused: Not on file     PHYSICAL EXAM:  VS: BP 116/81   Pulse 65   Ht 5\' 6"  (1.676 m)   Wt 185 lb (83.9 kg)   BMI 29.86 kg/m  Physical Exam Gen: NAD, alert, cooperative with exam, well-appearing ENT: normal lips, normal nasal mucosa,  Eye: normal EOM, normal conjunctiva and lids Skin: no rashes, no areas of induration  Neuro: normal tone, normal sensation to touch Psych:  normal insight, alert and oriented MSK:  Right shoulder: Limited active flexion abduction. Limited external and internal rotation. Normal strength resistance. Neurovascularly intact     ASSESSMENT & PLAN:   Adhesive capsulitis of right shoulder Has been doing well since the injection.  Range of motion is still limited. -Counseled on home exercise therapy and supportive care. -Could consider physical therapy.  Could consider repeat injection.

## 2019-03-03 ENCOUNTER — Encounter: Payer: Self-pay | Admitting: Obstetrics and Gynecology

## 2019-03-03 ENCOUNTER — Other Ambulatory Visit: Payer: Self-pay

## 2019-03-03 ENCOUNTER — Ambulatory Visit (INDEPENDENT_AMBULATORY_CARE_PROVIDER_SITE_OTHER): Payer: 59 | Admitting: Obstetrics and Gynecology

## 2019-03-03 VITALS — BP 110/68 | HR 54 | Temp 97.5°F | Ht 66.0 in | Wt 184.0 lb

## 2019-03-03 DIAGNOSIS — Z01419 Encounter for gynecological examination (general) (routine) without abnormal findings: Secondary | ICD-10-CM

## 2019-03-03 DIAGNOSIS — N898 Other specified noninflammatory disorders of vagina: Secondary | ICD-10-CM

## 2019-03-03 DIAGNOSIS — Z0000 Encounter for general adult medical examination without abnormal findings: Secondary | ICD-10-CM | POA: Diagnosis not present

## 2019-03-03 DIAGNOSIS — Z30431 Encounter for routine checking of intrauterine contraceptive device: Secondary | ICD-10-CM

## 2019-03-03 DIAGNOSIS — Z833 Family history of diabetes mellitus: Secondary | ICD-10-CM

## 2019-03-03 MED ORDER — MEDROXYPROGESTERONE ACETATE 5 MG PO TABS: ORAL_TABLET | ORAL | 3 refills | Status: DC

## 2019-03-03 NOTE — Assessment & Plan Note (Signed)
Has been doing well since the injection.  Range of motion is still limited. -Counseled on home exercise therapy and supportive care. -Could consider physical therapy.  Could consider repeat injection.

## 2019-03-03 NOTE — Patient Instructions (Signed)
EXERCISE AND DIET:  We recommended that you start or continue a regular exercise program for good health. Regular exercise means any activity that makes your heart beat faster and makes you sweat.  We recommend exercising at least 30 minutes per day at least 3 days a week, preferably 4 or 5.  We also recommend a diet low in fat and sugar.  Inactivity, poor dietary choices and obesity can cause diabetes, heart attack, stroke, and kidney damage, among others.    ALCOHOL AND SMOKING:  Women should limit their alcohol intake to no more than 7 drinks/beers/glasses of wine (combined, not each!) per week. Moderation of alcohol intake to this level decreases your risk of breast cancer and liver damage. And of course, no recreational drugs are part of a healthy lifestyle.  And absolutely no smoking or even second hand smoke. Most people know smoking can cause heart and lung diseases, but did you know it also contributes to weakening of your bones? Aging of your skin?  Yellowing of your teeth and nails?  CALCIUM AND VITAMIN D:  Adequate intake of calcium and Vitamin D are recommended.  The recommendations for exact amounts of these supplements seem to change often, but generally speaking 1,000 mg of calcium (between diet and supplement) and 800 units of Vitamin D per day seems prudent. Certain women may benefit from higher intake of Vitamin D.  If you are among these women, your doctor will have told you during your visit.    PAP SMEARS:  Pap smears, to check for cervical cancer or precancers,  have traditionally been done yearly, although recent scientific advances have shown that most women can have pap smears less often.  However, every woman still should have a physical exam from her gynecologist every year. It will include a breast check, inspection of the vulva and vagina to check for abnormal growths or skin changes, a visual exam of the cervix, and then an exam to evaluate the size and shape of the uterus and  ovaries.  And after 49 years of age, a rectal exam is indicated to check for rectal cancers. We will also provide age appropriate advice regarding health maintenance, like when you should have certain vaccines, screening for sexually transmitted diseases, bone density testing, colonoscopy, mammograms, etc.   MAMMOGRAMS:  All women over 40 years old should have a yearly mammogram. Many facilities now offer a "3D" mammogram, which may cost around $50 extra out of pocket. If possible,  we recommend you accept the option to have the 3D mammogram performed.  It both reduces the number of women who will be called back for extra views which then turn out to be normal, and it is better than the routine mammogram at detecting truly abnormal areas.    COLON CANCER SCREENING: Now recommend starting at age 45. At this time colonoscopy is not covered for routine screening until 50. There are take home tests that can be done between 45-49.   COLONOSCOPY:  Colonoscopy to screen for colon cancer is recommended for all women at age 50.  We know, you hate the idea of the prep.  We agree, BUT, having colon cancer and not knowing it is worse!!  Colon cancer so often starts as a polyp that can be seen and removed at colonscopy, which can quite literally save your life!  And if your first colonoscopy is normal and you have no family history of colon cancer, most women don't have to have it again for   10 years.  Once every ten years, you can do something that may end up saving your life, right?  We will be happy to help you get it scheduled when you are ready.  Be sure to check your insurance coverage so you understand how much it will cost.  It may be covered as a preventative service at no cost, but you should check your particular policy.      Breast Self-Awareness Breast self-awareness means being familiar with how your breasts look and feel. It involves checking your breasts regularly and reporting any changes to your  health care provider. Practicing breast self-awareness is important. A change in your breasts can be a sign of a serious medical problem. Being familiar with how your breasts look and feel allows you to find any problems early, when treatment is more likely to be successful. All women should practice breast self-awareness, including women who have had breast implants. How to do a breast self-exam One way to learn what is normal for your breasts and whether your breasts are changing is to do a breast self-exam. To do a breast self-exam: Look for Changes  1. Remove all the clothing above your waist. 2. Stand in front of a mirror in a room with good lighting. 3. Put your hands on your hips. 4. Push your hands firmly downward. 5. Compare your breasts in the mirror. Look for differences between them (asymmetry), such as: ? Differences in shape. ? Differences in size. ? Puckers, dips, and bumps in one breast and not the other. 6. Look at each breast for changes in your skin, such as: ? Redness. ? Scaly areas. 7. Look for changes in your nipples, such as: ? Discharge. ? Bleeding. ? Dimpling. ? Redness. ? A change in position. Feel for Changes Carefully feel your breasts for lumps and changes. It is best to do this while lying on your back on the floor and again while sitting or standing in the shower or tub with soapy water on your skin. Feel each breast in the following way:  Place the arm on the side of the breast you are examining above your head.  Feel your breast with the other hand.  Start in the nipple area and make  inch (2 cm) overlapping circles to feel your breast. Use the pads of your three middle fingers to do this. Apply light pressure, then medium pressure, then firm pressure. The light pressure will allow you to feel the tissue closest to the skin. The medium pressure will allow you to feel the tissue that is a little deeper. The firm pressure will allow you to feel the tissue  close to the ribs.  Continue the overlapping circles, moving downward over the breast until you feel your ribs below your breast.  Move one finger-width toward the center of the body. Continue to use the  inch (2 cm) overlapping circles to feel your breast as you move slowly up toward your collarbone.  Continue the up and down exam using all three pressures until you reach your armpit.  Write Down What You Find  Write down what is normal for each breast and any changes that you find. Keep a written record with breast changes or normal findings for each breast. By writing this information down, you do not need to depend only on memory for size, tenderness, or location. Write down where you are in your menstrual cycle, if you are still menstruating. If you are having trouble noticing differences   in your breasts, do not get discouraged. With time you will become more familiar with the variations in your breasts and more comfortable with the exam. How often should I examine my breasts? Examine your breasts every month. If you are breastfeeding, the best time to examine your breasts is after a feeding or after using a breast pump. If you menstruate, the best time to examine your breasts is 5-7 days after your period is over. During your period, your breasts are lumpier, and it may be more difficult to notice changes. When should I see my health care provider? See your health care provider if you notice:  A change in shape or size of your breasts or nipples.  A change in the skin of your breast or nipples, such as a reddened or scaly area.  Unusual discharge from your nipples.  A lump or thick area that was not there before.  Pain in your breasts.  Anything that concerns you.  

## 2019-03-04 LAB — LIPID PANEL
Chol/HDL Ratio: 2.6 ratio (ref 0.0–4.4)
Cholesterol, Total: 195 mg/dL (ref 100–199)
HDL: 74 mg/dL (ref >39)
LDL Chol Calc (NIH): 104 mg/dL — ABNORMAL HIGH (ref 0–99)
Triglycerides: 97 mg/dL (ref 0–149)
VLDL Cholesterol Cal: 17 mg/dL (ref 5–40)

## 2019-03-04 LAB — COMPREHENSIVE METABOLIC PANEL
ALT: 16 IU/L (ref 0–32)
AST: 17 IU/L (ref 0–40)
Albumin/Globulin Ratio: 1.6 (ref 1.2–2.2)
Albumin: 4.3 g/dL (ref 3.8–4.8)
Alkaline Phosphatase: 72 IU/L (ref 39–117)
BUN/Creatinine Ratio: 13 (ref 9–23)
BUN: 11 mg/dL (ref 6–24)
Bilirubin Total: 0.5 mg/dL (ref 0.0–1.2)
CO2: 25 mmol/L (ref 20–29)
Calcium: 9.7 mg/dL (ref 8.7–10.2)
Chloride: 101 mmol/L (ref 96–106)
Creatinine, Ser: 0.85 mg/dL (ref 0.57–1.00)
GFR calc Af Amer: 94 mL/min/{1.73_m2} (ref >59)
GFR calc non Af Amer: 81 mL/min/{1.73_m2} (ref >59)
Globulin, Total: 2.7 g/dL (ref 1.5–4.5)
Glucose: 80 mg/dL (ref 65–99)
Potassium: 4.4 mmol/L (ref 3.5–5.2)
Sodium: 138 mmol/L (ref 134–144)
Total Protein: 7 g/dL (ref 6.0–8.5)

## 2019-03-04 LAB — CBC
Hematocrit: 40.3 % (ref 34.0–46.6)
Hemoglobin: 13.8 g/dL (ref 11.1–15.9)
MCH: 32.5 pg (ref 26.6–33.0)
MCHC: 34.2 g/dL (ref 31.5–35.7)
MCV: 95 fL (ref 79–97)
Platelets: 270 10*3/uL (ref 150–450)
RBC: 4.25 x10E6/uL (ref 3.77–5.28)
RDW: 12.5 % (ref 11.7–15.4)
WBC: 7.1 10*3/uL (ref 3.4–10.8)

## 2019-03-04 LAB — HEMOGLOBIN A1C
Est. average glucose Bld gHb Est-mCnc: 91 mg/dL
Hgb A1c MFr Bld: 4.8 % (ref 4.8–5.6)

## 2019-03-05 LAB — VAGINITIS/VAGINOSIS, DNA PROBE
Candida Species: NEGATIVE
Gardnerella vaginalis: POSITIVE — AB
Trichomonas vaginosis: NEGATIVE

## 2019-04-08 ENCOUNTER — Other Ambulatory Visit: Payer: Self-pay

## 2019-04-08 ENCOUNTER — Ambulatory Visit (INDEPENDENT_AMBULATORY_CARE_PROVIDER_SITE_OTHER): Payer: 59 | Admitting: Family Medicine

## 2019-04-08 ENCOUNTER — Ambulatory Visit: Payer: Self-pay

## 2019-04-08 VITALS — BP 120/72 | Ht 66.0 in | Wt 179.0 lb

## 2019-04-08 DIAGNOSIS — M7501 Adhesive capsulitis of right shoulder: Secondary | ICD-10-CM | POA: Diagnosis not present

## 2019-04-08 MED ORDER — KETOROLAC TROMETHAMINE 30 MG/ML IJ SOLN
30.0000 mg | Freq: Once | INTRAMUSCULAR | Status: AC
  Administered 2019-04-08: 30 mg via INTRA_ARTICULAR

## 2019-04-08 NOTE — Patient Instructions (Signed)
Good to see you Great job on the weight loss.  Please continue the range of motion movements   Please send me a message in MyChart with any questions or updates.  Please see me back in 6 weeks or as needed.   --Dr. Jordan Likes

## 2019-04-08 NOTE — Assessment & Plan Note (Signed)
Acute on chronic in nature.  Limited with range of motion and pain started acutely occurring. -Injection. -Counseled on exercise therapy and supportive care.

## 2019-04-08 NOTE — Progress Notes (Signed)
Lauren Randolph - 49 y.o. female MRN [id number]  Date of birth: January 26, 1971  SUBJECTIVE:  Including CC & ROS.  No chief complaint on file.   Lauren Randolph is a 49 y.o. female that is presenting with acute right shoulder pain.  Has been dealing with her frozen shoulder for about a year.  Has received steroid injection in the past.  Continues to get improvement with her range of motion.  The pain started to return over the past 2 weeks.  Denies any injury or inciting event   Review of Systems See HPI   HISTORY: Past Medical, Surgical, Social, and Family History Reviewed & Updated per EMR.   Pertinent Historical Findings include:  Past Medical History:  Diagnosis Date  . Chronic low back pain   . History of kidney stones    at age 85  . Sinusitis   . STD (sexually transmitted disease)    hx of HSVII since age 70    No past surgical history on file.  Family History  Problem Relation Age of Onset  . Heart disease Father   . Other Father        Dec MVA due to poss heart attack at age 18  . Diabetes Father        AODM  . Hypertension Father   . Heart attack Father   . Heart attack Sister 41  . Hypertension Mother   . Heart disease Sister   . Stroke Maternal Grandmother   . Brain cancer Paternal Uncle   . Brain cancer Paternal Grandfather     Social History   Socioeconomic History  . Marital status: Married    Spouse name: Not on file  . Number of children: Not on file  . Years of education: Not on file  . Highest education level: Not on file  Occupational History  . Not on file  Tobacco Use  . Smoking status: Never Smoker  . Smokeless tobacco: Never Used  Substance and Sexual Activity  . Alcohol use: Yes    Alcohol/week: 0.0 - 1.0 standard drinks  . Drug use: No  . Sexual activity: Yes    Partners: Male    Birth control/protection: I.U.D.  Other Topics Concern  . Not on file  Social History Narrative  . Not on file   Social Determinants of Health    Financial Resource Strain:   . Difficulty of Paying Living Expenses: Not on file  Food Insecurity:   . Worried About Charity fundraiser in the Last Year: Not on file  . Ran Out of Food in the Last Year: Not on file  Transportation Needs:   . Lack of Transportation (Medical): Not on file  . Lack of Transportation (Non-Medical): Not on file  Physical Activity:   . Days of Exercise per Week: Not on file  . Minutes of Exercise per Session: Not on file  Stress:   . Feeling of Stress : Not on file  Social Connections:   . Frequency of Communication with Friends and Family: Not on file  . Frequency of Social Gatherings with Friends and Family: Not on file  . Attends Religious Services: Not on file  . Active Member of Clubs or Organizations: Not on file  . Attends Archivist Meetings: Not on file  . Marital Status: Not on file  Intimate Partner Violence:   . Fear of Current or Ex-Partner: Not on file  . Emotionally Abused: Not on file  . Physically Abused:  Not on file  . Sexually Abused: Not on file     PHYSICAL EXAM:  VS: BP 120/72   Ht 5\' 6"  (1.676 m)   Wt 179 lb (81.2 kg)   BMI 28.89 kg/m  Physical Exam Gen: NAD, alert, cooperative with exam, well-appearing MSK:  Right shoulder: Limited abduction and flexion. Limited external rotation. Normal strength resistance. Neurovascularly intact   Aspiration/Injection Procedure Note Lauren Randolph 06-30-1970  Procedure: Injection Indications: Right shoulder pain  Procedure Details Consent: Risks of procedure as well as the alternatives and risks of each were explained to the (patient/caregiver).  Consent for procedure obtained. Time Out: Verified patient identification, verified procedure, site/side was marked, verified correct patient position, special equipment/implants available, medications/allergies/relevent history reviewed, required imaging and test results available.  Performed.  The area was cleaned with  iodine and alcohol swabs.    The glenohumeral joint was injected using 3 cc of 1% lidocaine,1 cc's of 30 mg Toradol and 3 cc's of 0.25% bupivacaine with a 21 2" needle.  Ultrasound was used. Images were obtained in short views showing the injection.     A sterile dressing was applied.  Patient did tolerate procedure well.     ASSESSMENT & PLAN:   Adhesive capsulitis of right shoulder Acute on chronic in nature.  Limited with range of motion and pain started acutely occurring. -Injection. -Counseled on exercise therapy and supportive care.

## 2019-04-28 ENCOUNTER — Emergency Department (HOSPITAL_BASED_OUTPATIENT_CLINIC_OR_DEPARTMENT_OTHER)
Admission: EM | Admit: 2019-04-28 | Discharge: 2019-04-28 | Disposition: A | Discharge status: Home / Self Care | Payer: 59 | Attending: Emergency Medicine | Admitting: Emergency Medicine

## 2019-04-28 ENCOUNTER — Telehealth: Payer: Self-pay | Admitting: Obstetrics and Gynecology

## 2019-04-28 ENCOUNTER — Encounter (HOSPITAL_BASED_OUTPATIENT_CLINIC_OR_DEPARTMENT_OTHER): Payer: Self-pay

## 2019-04-28 ENCOUNTER — Encounter: Payer: Self-pay | Admitting: Obstetrics and Gynecology

## 2019-04-28 ENCOUNTER — Other Ambulatory Visit: Payer: Self-pay

## 2019-04-28 ENCOUNTER — Emergency Department (HOSPITAL_BASED_OUTPATIENT_CLINIC_OR_DEPARTMENT_OTHER): Payer: 59

## 2019-04-28 DIAGNOSIS — N83202 Unspecified ovarian cyst, left side: Secondary | ICD-10-CM

## 2019-04-28 DIAGNOSIS — Z79899 Other long term (current) drug therapy: Secondary | ICD-10-CM | POA: Insufficient documentation

## 2019-04-28 DIAGNOSIS — R1013 Epigastric pain: Secondary | ICD-10-CM

## 2019-04-28 LAB — CBC WITH DIFFERENTIAL/PLATELET
Abs Immature Granulocytes: 0.02 10*3/uL (ref 0.00–0.07)
Basophils Absolute: 0 10*3/uL (ref 0.0–0.1)
Basophils Relative: 0 %
Eosinophils Absolute: 0.1 10*3/uL (ref 0.0–0.5)
Eosinophils Relative: 2 %
HCT: 37.5 % (ref 36.0–46.0)
Hemoglobin: 12.9 g/dL (ref 12.0–15.0)
Immature Granulocytes: 0 %
Lymphocytes Relative: 16 %
Lymphs Abs: 1.2 10*3/uL (ref 0.7–4.0)
MCH: 32.7 pg (ref 26.0–34.0)
MCHC: 34.4 g/dL (ref 30.0–36.0)
MCV: 94.9 fL (ref 80.0–100.0)
Monocytes Absolute: 0.8 10*3/uL (ref 0.1–1.0)
Monocytes Relative: 10 %
Neutro Abs: 5.4 10*3/uL (ref 1.7–7.7)
Neutrophils Relative %: 72 %
Platelets: 213 10*3/uL (ref 150–400)
RBC: 3.95 MIL/uL (ref 3.87–5.11)
RDW: 12.3 % (ref 11.5–15.5)
WBC: 7.5 10*3/uL (ref 4.0–10.5)
nRBC: 0 % (ref 0.0–0.2)

## 2019-04-28 LAB — COMPREHENSIVE METABOLIC PANEL
ALT: 18 U/L (ref 0–44)
AST: 19 U/L (ref 15–41)
Albumin: 4.2 g/dL (ref 3.5–5.0)
Alkaline Phosphatase: 54 U/L (ref 38–126)
Anion gap: 10 (ref 5–15)
BUN: 9 mg/dL (ref 6–20)
CO2: 23 mmol/L (ref 22–32)
Calcium: 9.1 mg/dL (ref 8.9–10.3)
Chloride: 106 mmol/L (ref 98–111)
Creatinine, Ser: 0.7 mg/dL (ref 0.44–1.00)
GFR calc Af Amer: >60 mL/min (ref >60)
GFR calc non Af Amer: >60 mL/min (ref >60)
Glucose, Bld: 106 mg/dL — ABNORMAL HIGH (ref 70–99)
Potassium: 3.9 mmol/L (ref 3.5–5.1)
Sodium: 139 mmol/L (ref 135–145)
Total Bilirubin: 1.2 mg/dL (ref 0.3–1.2)
Total Protein: 7.1 g/dL (ref 6.5–8.1)

## 2019-04-28 LAB — URINALYSIS, ROUTINE W REFLEX MICROSCOPIC
Bilirubin Urine: NEGATIVE
Glucose, UA: NEGATIVE mg/dL
Hgb urine dipstick: NEGATIVE
Ketones, ur: 15 mg/dL — AB
Leukocytes,Ua: NEGATIVE
Nitrite: NEGATIVE
Protein, ur: NEGATIVE mg/dL
Specific Gravity, Urine: 1.02 (ref 1.005–1.030)
pH: 6 (ref 5.0–8.0)

## 2019-04-28 LAB — PREGNANCY, URINE: Preg Test, Ur: NEGATIVE

## 2019-04-28 LAB — LIPASE, BLOOD: Lipase: 19 U/L (ref 11–51)

## 2019-04-28 MED ORDER — ONDANSETRON HCL 4 MG/2ML IJ SOLN
4.0000 mg | Freq: Once | INTRAMUSCULAR | Status: AC
  Administered 2019-04-28: 4 mg via INTRAVENOUS
  Filled 2019-04-28: qty 2

## 2019-04-28 MED ORDER — FAMOTIDINE 20 MG PO TABS: 20.0000 mg | ORAL_TABLET | Freq: Two times a day (BID) | ORAL | 0 refills | Status: DC

## 2019-04-28 MED ORDER — IOHEXOL 300 MG/ML  SOLN
100.0000 mL | Freq: Once | INTRAMUSCULAR | Status: AC | PRN
  Administered 2019-04-28: 11:00:00 100 mL via INTRAVENOUS

## 2019-04-28 MED ORDER — ONDANSETRON 8 MG PO TBDP: 8.0000 mg | ORAL_TABLET | Freq: Three times a day (TID) | ORAL | 0 refills | Status: DC | PRN

## 2019-04-28 MED ORDER — DICYCLOMINE HCL 20 MG PO TABS: 20.0000 mg | ORAL_TABLET | Freq: Two times a day (BID) | ORAL | 0 refills | Status: DC

## 2019-04-28 MED FILL — ONDANSETRON ODT 8 MG TABLET: 8 | 4 days supply | Qty: 12 | Fill #0

## 2019-04-28 MED FILL — FAMOTIDINE 20 MG TABS: 20 | 30 days supply | Qty: 30 | Fill #0

## 2019-04-28 MED FILL — DICYCLOMINE 20 MG TABLET: 20 | 10 days supply | Qty: 20 | Fill #0

## 2019-04-28 NOTE — ED Triage Notes (Signed)
Pt states beginning Monday reports abdominal bloating and pain left lower side into groin.  Denies urinary symptoms, bowel movement Tuesday.  Took a detox tea.

## 2019-04-28 NOTE — Telephone Encounter (Signed)
Dr. Oscar La -please review 04/28/19 CT abd/pelvis in Epic and advise on f/u.

## 2019-04-28 NOTE — ED Provider Notes (Signed)
[facility] EMERGENCY DEPARTMENT Provider Note   CSN: [id number] Arrival date & time: 04/28/19  0730     History Chief Complaint  Patient presents with  . Abdominal Pain    Lauren Randolph is a 49 y.o. female.  HPI   Patient presents to the ED for evaluation abdominal pain.  Patient states it started on Monday.  She experienced a bloating abdominal discomfort.  Patient felt mostly full in the upper abdomen.  She took an herbal laxative tea and started to feel little better the next morning after having a bowel movement.  Pain never completely resolved she started having increasing discomfort again.  Yesterday she was having pain in her left lower quadrant that also seem to go towards her back and hip.  This morning the symptoms were more intense.  She has felt nauseated but not has not had any vomiting.  She denies any diarrhea or constipation.  She denies any dysuria.  No vaginal bleeding or discharge.  No history of diverticulitis.  Past Medical History:  Diagnosis Date  . Chronic low back pain   . History of kidney stones    at age 18  . Sinusitis   . STD (sexually transmitted disease)    hx of HSVII since age 36    Patient Active Problem List   Diagnosis Date Noted  . Adhesive capsulitis of right shoulder 12/08/2018  . Degenerative disc disease at L5-S1 level 08/04/2003    History reviewed. No pertinent surgical history.   OB History    Gravida  2   Para  0   Term  0   Preterm  0   AB  2   Living  0     SAB  0   TAB  2   Ectopic  0   Multiple  0   Live Births              Family History  Problem Relation Age of Onset  . Heart disease Father   . Other Father        Dec MVA due to poss heart attack at age 83  . Diabetes Father        AODM  . Hypertension Father   . Heart attack Father   . Heart attack Sister 5  . Hypertension Mother   . Heart disease Sister   . Stroke Maternal Grandmother   . Brain cancer Paternal Uncle   .  Brain cancer Paternal Grandfather     Social History   Tobacco Use  . Smoking status: Never Smoker  . Smokeless tobacco: Never Used  Substance Use Topics  . Alcohol use: Yes    Alcohol/week: 0.0 - 1.0 standard drinks  . Drug use: No    Home Medications Prior to Admission medications   Medication Sig Start Date End Date Taking? Authorizing Provider  Ibuprofen-Famotidine 800-26.6 MG TABS Take 1 tablet by mouth 3 (three) times daily. 12/08/18  Yes Myra Rude, MD  dicyclomine (BENTYL) 20 MG tablet Take 1 tablet (20 mg total) by mouth 2 (two) times daily. 04/28/19   Linwood Dibbles, MD  famotidine (PEPCID) 20 MG tablet Take 1 tablet (20 mg total) by mouth 2 (two) times daily. 04/28/19   Linwood Dibbles, MD  loratadine (CLARITIN) 10 MG tablet Take 10 mg by mouth daily as needed for allergies.    [provider]  medroxyPROGESTERone (PROVERA) 5 MG tablet Take one tablet daily x 5 days now and every month  if no spontaneous menses. 03/03/19   Salvadore Dom, MD  ondansetron (ZOFRAN ODT) 8 MG disintegrating tablet Take 1 tablet (8 mg total) by mouth every 8 (eight) hours as needed for nausea or vomiting. 04/28/19   Dorie Rank, MD  [facility] INTRAUTERINE COPPER IU by Intrauterine route.    [provider]  valACYclovir (VALTREX) 500 MG tablet 1 tab po bid x 3 days prn 12/07/14   Salvadore Dom, MD    Allergies    Patient has no known allergies.  Review of Systems   Review of Systems  Constitutional: Negative for fever.  Respiratory: Negative for shortness of breath.   Cardiovascular: Negative for chest pain.  Gastrointestinal: Positive for abdominal pain.  Genitourinary: Negative for dysuria, vaginal bleeding and vaginal discharge.  All other systems reviewed and are negative.   Physical Exam Updated Vital Signs BP 108/64 (BP Location: Left Arm)   Pulse 60   Temp 98.2 F (36.8 C) (Oral)   Resp 16   Ht 1.676 m (5\' 6" )   Wt 79.4 kg   SpO2 100%   BMI 28.25  kg/m   Physical Exam Vitals and nursing note reviewed.  Constitutional:      General: She is not in acute distress.    Appearance: She is well-developed.  HENT:     Head: Normocephalic and atraumatic.     Right Ear: External ear normal.     Left Ear: External ear normal.  Eyes:     General: No scleral icterus.       Right eye: No discharge.        Left eye: No discharge.     Conjunctiva/sclera: Conjunctivae normal.  Neck:     Trachea: No tracheal deviation.  Cardiovascular:     Rate and Rhythm: Normal rate and regular rhythm.  Pulmonary:     Effort: Pulmonary effort is normal. No respiratory distress.     Breath sounds: Normal breath sounds. No stridor. No wheezing or rales.  Abdominal:     General: Bowel sounds are normal. There is no distension.     Palpations: Abdomen is soft.     Tenderness: There is abdominal tenderness in the left lower quadrant. There is no guarding or rebound.     Hernia: No hernia is present.  Musculoskeletal:        General: No tenderness.     Cervical back: Neck supple.  Skin:    General: Skin is warm and dry.     Findings: No rash.  Neurological:     Mental Status: She is alert.     Cranial Nerves: No cranial nerve deficit (no facial droop, extraocular movements intact, no slurred speech).     Sensory: No sensory deficit.     Motor: No abnormal muscle tone or seizure activity.     Coordination: Coordination normal.     ED Results / Procedures / Treatments   Labs (all labs ordered are listed, but only abnormal results are displayed) Labs Reviewed  URINALYSIS, ROUTINE W REFLEX MICROSCOPIC - Abnormal; Notable for the following components:      Result Value   Ketones, ur 15 (*)    All other components within normal limits  COMPREHENSIVE METABOLIC PANEL - Abnormal; Notable for the following components:   Glucose, Bld 106 (*)    All other components within normal limits  PREGNANCY, URINE  LIPASE, BLOOD  CBC WITH DIFFERENTIAL/PLATELET     EKG None  Radiology CT ABDOMEN PELVIS W CONTRAST  Result Date: 04/28/2019 CLINICAL DATA:  Abdomen pain. Assess for diverticulitis. EXAM: CT ABDOMEN AND PELVIS WITH CONTRAST TECHNIQUE: Multidetector CT imaging of the abdomen and pelvis was performed using the standard protocol following bolus administration of intravenous contrast. CONTRAST:  OMNIPAQUE IOHEXOL 300 MG/ML  SOLN COMPARISON:  None. FINDINGS: Lower chest: No acute abnormality. Hepatobiliary: No focal liver abnormality is seen. No gallstones, gallbladder wall thickening, or biliary dilatation. Pancreas: Unremarkable. No pancreatic ductal dilatation or surrounding inflammatory changes. Spleen: Normal in size without focal abnormality. Adrenals/Urinary Tract: Adrenal glands are unremarkable. Kidneys are normal, without renal calculi, focal lesion, or hydronephrosis. Bladder is unremarkable. Stomach/Bowel: Stomach is within normal limits. Appendix appears normal. No evidence of bowel wall thickening, distention, or inflammatory changes. Vascular/Lymphatic: No significant vascular findings are present. No enlarged abdominal or pelvic lymph nodes. Reproductive: IUD is identified in the uterus. There is a 4.5 x 3.7 cm cyst in the left ovary likely follicular cyst. The right adnexa is normal. Other: None. Musculoskeletal: Degenerative joint changes at L5-S1 are noted. IMPRESSION: 1. No evidence of diverticulitis or bowel obstruction. 2. 4.5 cm cyst in the left ovary likely follicular cyst. Electronically Signed   By: Sherian Rein M.D.   On: 04/28/2019 11:24    Procedures Procedures (including critical care time)  Medications Ordered in ED Medications  ondansetron (ZOFRAN) injection 4 mg (4 mg Intravenous Given 04/28/19 0806)  iohexol (OMNIPAQUE) 300 MG/ML solution 100 mL (100 mLs Intravenous Contrast Given 04/28/19 1116)    ED Course  I have reviewed the triage vital signs and the nursing notes.  Pertinent labs & imaging results  that were available during my care of the patient were reviewed by me and considered in my medical decision making (see chart for details).  Clinical Course as of Apr 28 1146  Thu Apr 28, 2019  0830 Labs reviewed.  Unremarkable.   [JK]  0849 Still having pain and concern.  Worried about diverticulitis.  Will ct   [JK]    Clinical Course User Index [JK] Linwood Dibbles, MD   MDM Rules/Calculators/A&P                      Patient's ED work-up is reassuring.  No evidence of diverticulitis or other acute abnormalities.  Ovarian cyst noted but I doubt this is the etiology for all of her symptoms.  Plan on discharge home with antacids and Bentyl.  Discussed outpatient follow-up with her primary care doctor.  Also recommended follow-up ultrasound. Final Clinical Impression(s) / ED Diagnoses Final diagnoses:  Epigastric pain  Cyst of left ovary    Rx / DC Orders ED Discharge Orders         Ordered    famotidine (PEPCID) 20 MG tablet  2 times daily     04/28/19 1148    dicyclomine (BENTYL) 20 MG tablet  2 times daily     04/28/19 1148    ondansetron (ZOFRAN ODT) 8 MG disintegrating tablet  Every 8 hours PRN     04/28/19 1148           Linwood Dibbles, MD 04/28/19 1149

## 2019-04-28 NOTE — Telephone Encounter (Signed)
Patient sent the following correspondence through MyChart.  Hi, Today I went into the [facility] because I have had abdominal pain since Monday morning. They did a CT scan and found a cyst on my left ovary, the side where most of my pain was. No signs of other gastro issues. What do we need to do about this?   Thanks, Lauren Randolph

## 2019-04-28 NOTE — Discharge Instructions (Addendum)
Follow-up with your OB/GYN or primary care doctor regarding your ovarian cyst.  These typically do not require any treatment but follow-up ultrasounds are usually performed.   For your abdominal pain, follow-up with your primary care doctor if not improving.  Take the medications as prescribed

## 2019-04-28 NOTE — ED Notes (Signed)
ED Provider at bedside. 

## 2019-04-29 NOTE — Telephone Encounter (Signed)
Spoke to pt. Pt seen in ER yesterday for bloating, pain and ovarian cyst on left side. Pt needing follow up with Dr Oscar La and is also wanting to discuss cycles that pt states has had normal cycle in Jan, Feb and March. Pt believes that the cyst just developed. Pt has IUD that was placed in 2016. Will review with Dr Oscar La for plan of care.

## 2019-04-29 NOTE — Telephone Encounter (Signed)
Patient is calling to speak with the nurse. She was seen in the ER at [facility] for abdominal pain.

## 2019-04-29 NOTE — Telephone Encounter (Signed)
Spoke back with pt. Pt scheduled for PUS/OV with Dr Oscar La on 05/05/2019 at 1030 am. Pt aware and agreeable. Pt given instructions to go back to ER if having heavy bleeding or severe abd pain. Pt verbalized understanding.   Routing to Dr Oscar La for review and will close encounter.  Cc: Hedda Slade for precert. Orders placed.

## 2019-04-29 NOTE — Telephone Encounter (Signed)
Please schedule her for a visit and ultrasound next week. If her pain becomes severe she needs to go back to the ER.

## 2019-05-04 ENCOUNTER — Other Ambulatory Visit: Payer: Self-pay

## 2019-05-05 ENCOUNTER — Ambulatory Visit (INDEPENDENT_AMBULATORY_CARE_PROVIDER_SITE_OTHER): Payer: 59 | Admitting: Obstetrics and Gynecology

## 2019-05-05 ENCOUNTER — Ambulatory Visit (INDEPENDENT_AMBULATORY_CARE_PROVIDER_SITE_OTHER): Payer: 59

## 2019-05-05 ENCOUNTER — Encounter: Payer: Self-pay | Admitting: Obstetrics and Gynecology

## 2019-05-05 VITALS — BP 108/72 | HR 64 | Temp 97.5°F | Resp 10 | Ht 66.0 in | Wt 178.6 lb

## 2019-05-05 DIAGNOSIS — N83202 Unspecified ovarian cyst, left side: Secondary | ICD-10-CM | POA: Diagnosis not present

## 2019-05-05 DIAGNOSIS — R1032 Left lower quadrant pain: Secondary | ICD-10-CM

## 2019-05-05 DIAGNOSIS — N926 Irregular menstruation, unspecified: Secondary | ICD-10-CM

## 2019-05-05 NOTE — Patient Instructions (Signed)
Ovarian Cyst     An ovarian cyst is a fluid-filled sac that forms on an ovary. The ovaries are small organs that produce eggs in women. Various types of cysts can form on the ovaries. Some may cause symptoms and require treatment. Most ovarian cysts go away on their own, are not cancerous (are benign), and do not cause problems. Common types of ovarian cysts include:  Functional (follicle) cysts. ? Occur during the menstrual cycle, and usually go away with the next menstrual cycle if you do not get pregnant. ? Usually cause no symptoms.  Endometriomas. ? Are cysts that form from the tissue that lines the uterus (endometrium). ? Are sometimes called "chocolate cysts" because they become filled with blood that turns brown. ? Can cause pain in the lower abdomen during intercourse and during your period.  Cystadenoma cysts. ? Develop from cells on the outside surface of the ovary. ? Can get very large and cause lower abdomen pain and pain with intercourse. ? Can cause severe pain if they twist or break open (rupture).  Dermoid cysts. ? Are sometimes found in both ovaries. ? May contain different kinds of body tissue, such as skin, teeth, hair, or cartilage. ? Usually do not cause symptoms unless they get very big.  Theca lutein cysts. ? Occur when too much of a certain hormone (human chorionic gonadotropin) is produced and overstimulates the ovaries to produce an egg. ? Are most common after having procedures used to assist with the conception of a baby (in vitro fertilization). What are the causes? Ovarian cysts may be caused by:  Ovarian hyperstimulation syndrome. This is a condition that can develop from taking fertility medicines. It causes multiple large ovarian cysts to form.  Polycystic ovarian syndrome (PCOS). This is a common hormonal disorder that can cause ovarian cysts, as well as problems with your period or fertility. What increases the risk? The following factors may  make you more likely to develop ovarian cysts:  Being overweight or obese.  Taking fertility medicines.  Taking certain forms of hormonal birth control.  Smoking. What are the signs or symptoms? Many ovarian cysts do not cause symptoms. If symptoms are present, they may include:  Pelvic pain or pressure.  Pain in the lower abdomen.  Pain during sex.  Abdominal swelling.  Abnormal menstrual periods.  Increasing pain with menstrual periods. How is this diagnosed? These cysts are commonly found during a routine pelvic exam. You may have tests to find out more about the cyst, such as:  Ultrasound.  X-ray of the pelvis.  CT scan.  MRI.  Blood tests. How is this treated? Many ovarian cysts go away on their own without treatment. Your health care provider may want to check your cyst regularly for 2-3 months to see if it changes. If you are in menopause, it is especially important to have your cyst monitored closely because menopausal women have a higher rate of ovarian cancer. When treatment is needed, it may include:  Medicines to help relieve pain.  A procedure to drain the cyst (aspiration).  Surgery to remove the whole cyst.  Hormone treatment or birth control pills. These methods are sometimes used to help dissolve a cyst. Follow these instructions at home:  Take over-the-counter and prescription medicines only as told by your health care provider.  Do not drive or use heavy machinery while taking prescription pain medicine.  Get regular pelvic exams and Pap tests as often as told by your health care provider.    Return to your normal activities as told by your health care provider. Ask your health care provider what activities are safe for you.  Do not use any products that contain nicotine or tobacco, such as cigarettes and e-cigarettes. If you need help quitting, ask your health care provider.  Keep all follow-up visits as told by your health care provider.  This is important. Contact a health care provider if:  Your periods are late, irregular, or painful, or they stop.  You have pelvic pain that does not go away.  You have pressure on your bladder or trouble emptying your bladder completely.  You have pain during sex.  You have any of the following in your abdomen: ? A feeling of fullness. ? Pressure. ? Discomfort. ? Pain that does not go away. ? Swelling.  You feel generally ill.  You become constipated.  You lose your appetite.  You develop severe acne.  You start to have more body hair and facial hair.  You are gaining weight or losing weight without changing your exercise and eating habits.  You think you may be pregnant. Get help right away if:  You have abdominal pain that is severe or gets worse.  You cannot eat or drink without vomiting.  You suddenly develop a fever.  Your menstrual period is much heavier than usual. This information is not intended to replace advice given to you by your health care provider. Make sure you discuss any questions you have with your health care provider. Document Revised: 04/20/2017 Document Reviewed: 06/24/2015 Elsevier Patient Education  2020 Elsevier Inc.  

## 2019-05-05 NOTE — Progress Notes (Signed)
GYNECOLOGY  VISIT   HPI: 49 y.o.   Married White or Caucasian Not Hispanic or Latino  female   [id number] with Patient's last menstrual period was 04/11/2019.   here for f/u from the ER. She was seen on 04/28/19 with LLQ abdominal pain, CT revealed a 4.5 x 3.7 cm left ovarian cyst, IUD was in place.    Last week she woke up with bloating, she drank some detox tea and took gasex. The next day her bloating improved, but she started having pain on her whole left side of her abdomen, down to her groin and back. By last Thursday her pain was up to an 8/10 in severity, that is when she was seen in the ER and had the above CT. No other findings were noted.  Currently feeling better, only with mild intermittent pain.  Normal bowel and bladder function.   She has cyclic provera to take as needed. Cycles are mostly monthly x 7 days, saturates a regular tampon in 5 hours. In her last cycle she spotted x 2 weeks, that is the first time that has happened. No spotting since the 04/11/19  She has struggled with a frozen shoulder, wonders if it is related of provera.  She has lost 12 lbs since January. Trying to reduce sugar.   GYNECOLOGIC HISTORY: Patient's last menstrual period was 04/11/2019. Contraception:IUD, paragard (11/16) Menopausal hormone therapy: NA        OB History    Gravida  2   Para  0   Term  0   Preterm  0   AB  2   Living  0     SAB  0   TAB  2   Ectopic  0   Multiple  0   Live Births                 Patient Active Problem List   Diagnosis Date Noted  . Adhesive capsulitis of right shoulder 12/08/2018  . Degenerative disc disease at L5-S1 level 08/04/2003    Past Medical History:  Diagnosis Date  . Chronic low back pain   . History of kidney stones    at age 83  . Sinusitis   . STD (sexually transmitted disease)    hx of HSVII since age 2    History reviewed. No pertinent surgical history.  Current Outpatient Medications  Medication Sig Dispense  Refill  . Ibuprofen-Famotidine 800-26.6 MG TABS Take 1 tablet by mouth 3 (three) times daily. 90 tablet 3  . loratadine (CLARITIN) 10 MG tablet Take 10 mg by mouth daily as needed for allergies.    Marland Kitchen PARAGARD INTRAUTERINE COPPER IU by Intrauterine route.    . valACYclovir (VALTREX) 500 MG tablet 1 tab po bid x 3 days prn 30 tablet 1  . famotidine (PEPCID) 20 MG tablet Take 1 tablet (20 mg total) by mouth 2 (two) times daily. (Patient not taking: Reported on 05/05/2019) 30 tablet 0  . medroxyPROGESTERone (PROVERA) 5 MG tablet Take one tablet daily x 5 days now and every month if no spontaneous menses. (Patient not taking: Reported on 05/05/2019) 15 tablet 3  . ondansetron (ZOFRAN ODT) 8 MG disintegrating tablet Take 1 tablet (8 mg total) by mouth every 8 (eight) hours as needed for nausea or vomiting. (Patient not taking: Reported on 05/05/2019) 12 tablet 0   No current facility-administered medications for this visit.     ALLERGIES: Patient has no known allergies.  Family History  Problem Relation  Age of Onset  . Heart disease Father   . Other Father        Dec MVA due to poss heart attack at age 45  . Diabetes Father        AODM  . Hypertension Father   . Heart attack Father   . Heart attack Sister 50  . Hypertension Mother   . Heart disease Sister   . Stroke Maternal Grandmother   . Brain cancer Paternal Uncle   . Brain cancer Paternal Grandfather     Social History   Socioeconomic History  . Marital status: Married    Spouse name: Not on file  . Number of children: Not on file  . Years of education: Not on file  . Highest education level: Not on file  Occupational History  . Not on file  Tobacco Use  . Smoking status: Never Smoker  . Smokeless tobacco: Never Used  Substance and Sexual Activity  . Alcohol use: Yes    Alcohol/week: 0.0 - 1.0 standard drinks  . Drug use: No  . Sexual activity: Yes    Partners: Male    Birth control/protection: I.U.D.  Other Topics Concern   . Not on file  Social History Narrative  . Not on file   Social Determinants of Health   Financial Resource Strain:   . Difficulty of Paying Living Expenses:   Food Insecurity:   . Worried About Programme researcher, broadcasting/film/video in the Last Year:   . Barista in the Last Year:   Transportation Needs:   . Freight forwarder (Medical):   Marland Kitchen Lack of Transportation (Non-Medical):   Physical Activity:   . Days of Exercise per Week:   . Minutes of Exercise per Session:   Stress:   . Feeling of Stress :   Social Connections:   . Frequency of Communication with Friends and Family:   . Frequency of Social Gatherings with Friends and Family:   . Attends Religious Services:   . Active Member of Clubs or Organizations:   . Attends Banker Meetings:   Marland Kitchen Marital Status:   Intimate Partner Violence:   . Fear of Current or Ex-Partner:   . Emotionally Abused:   Marland Kitchen Physically Abused:   . Sexually Abused:     Review of Systems  Constitutional: Negative.   HENT: Negative.   Eyes: Negative.   Respiratory: Negative.   Cardiovascular: Negative.   Gastrointestinal: Negative.   Genitourinary: Negative.   Musculoskeletal: Negative.   Skin: Negative.   Neurological: Negative.   Endo/Heme/Allergies: Negative.   Psychiatric/Behavioral: Negative.     PHYSICAL EXAMINATION:    BP 108/72 (BP Location: Right Arm, Patient Position: Sitting, Cuff Size: Normal)   Pulse 64   Temp (!) 97.5 F (36.4 C) (Temporal)   Resp 10   Ht 5\' 6"  (1.676 m)   Wt 178 lb 9.6 oz (81 kg)   LMP 04/11/2019   BMI 28.83 kg/m     General appearance: alert, cooperative and appears stated age Abdomen: soft, non-tender; non distended, no masses,  no organomegaly    Ultrasound images reviewed with the patient. Left ovarian cyst is 3.6 x 3.1 x 3.8 cm, down from 4.5 x 3.7 cm in the ER last week.   ASSESSMENT Ovarian cyst, getting smaller Abdominal pain, improving Intermenstrual spotting x 1    PLAN As  long as her pain continues to resolve will not re image She will calendar her bleeding, call  with any persistent abnormalities  Continue with cyclic provera.    An After Visit Summary was printed and given to the patient.

## 2019-05-09 ENCOUNTER — Telehealth: Payer: Self-pay | Admitting: Obstetrics and Gynecology

## 2019-05-09 ENCOUNTER — Encounter: Payer: Self-pay | Admitting: Obstetrics and Gynecology

## 2019-05-09 NOTE — Telephone Encounter (Signed)
Erroneous encounter

## 2019-11-01 ENCOUNTER — Other Ambulatory Visit: Payer: Self-pay | Admitting: Obstetrics and Gynecology

## 2019-11-01 DIAGNOSIS — Z1231 Encounter for screening mammogram for malignant neoplasm of breast: Secondary | ICD-10-CM

## 2019-11-04 ENCOUNTER — Other Ambulatory Visit: Payer: Self-pay

## 2019-11-04 ENCOUNTER — Ambulatory Visit
Admission: RE | Admit: 2019-11-04 | Discharge: 2019-11-04 | Disposition: A | Discharge status: Home / Self Care | Payer: 59 | Source: Ambulatory Visit

## 2019-11-04 DIAGNOSIS — Z1231 Encounter for screening mammogram for malignant neoplasm of breast: Secondary | ICD-10-CM

## 2019-11-30 ENCOUNTER — Other Ambulatory Visit: Payer: Self-pay

## 2019-11-30 ENCOUNTER — Ambulatory Visit (INDEPENDENT_AMBULATORY_CARE_PROVIDER_SITE_OTHER): Payer: 59

## 2019-11-30 DIAGNOSIS — Z23 Encounter for immunization: Secondary | ICD-10-CM | POA: Diagnosis not present

## 2019-11-30 NOTE — Progress Notes (Signed)
Pt was given flu in left deltoid Pt tolerated well

## 2020-03-08 ENCOUNTER — Other Ambulatory Visit: Payer: Self-pay

## 2020-03-08 ENCOUNTER — Encounter: Payer: Self-pay | Admitting: Obstetrics and Gynecology

## 2020-03-08 ENCOUNTER — Ambulatory Visit (INDEPENDENT_AMBULATORY_CARE_PROVIDER_SITE_OTHER): Payer: 59 | Admitting: Obstetrics and Gynecology

## 2020-03-08 VITALS — BP 118/70 | HR 72 | Ht 65.0 in | Wt 172.0 lb

## 2020-03-08 DIAGNOSIS — Z01419 Encounter for gynecological examination (general) (routine) without abnormal findings: Secondary | ICD-10-CM | POA: Diagnosis not present

## 2020-03-08 DIAGNOSIS — N951 Menopausal and female climacteric states: Secondary | ICD-10-CM | POA: Diagnosis not present

## 2020-03-08 DIAGNOSIS — N952 Postmenopausal atrophic vaginitis: Secondary | ICD-10-CM

## 2020-03-08 DIAGNOSIS — Z30431 Encounter for routine checking of intrauterine contraceptive device: Secondary | ICD-10-CM | POA: Diagnosis not present

## 2020-03-08 DIAGNOSIS — Z833 Family history of diabetes mellitus: Secondary | ICD-10-CM

## 2020-03-08 DIAGNOSIS — N914 Secondary oligomenorrhea: Secondary | ICD-10-CM | POA: Diagnosis not present

## 2020-03-08 DIAGNOSIS — Z0000 Encounter for general adult medical examination without abnormal findings: Secondary | ICD-10-CM

## 2020-03-08 DIAGNOSIS — A6000 Herpesviral infection of urogenital system, unspecified: Secondary | ICD-10-CM

## 2020-03-08 MED ORDER — VALACYCLOVIR HCL 500 MG PO TABS: ORAL_TABLET | ORAL | 1 refills | Status: AC

## 2020-03-08 MED ORDER — MEDROXYPROGESTERONE ACETATE 5 MG PO TABS: ORAL_TABLET | ORAL | 3 refills | Status: AC

## 2020-03-08 NOTE — Patient Instructions (Signed)
EXERCISE   We recommended that you start or continue a regular exercise program for good health. Physical activity is anything that gets your body moving, some is better than none. The CDC recommends 150 minutes per week of Moderate-Intensity Aerobic Activity and 2 or more days of Muscle Strengthening Activity.  Benefits of exercise are limitless: helps weight loss/weight maintenance, improves mood and energy, helps with depression and anxiety, improves sleep, tones and strengthens muscles, improves balance, improves bone density, protects from chronic conditions such as heart disease, high blood pressure and diabetes and so much more. To learn more visit: https://www.cdc.gov/physicalactivity/index.html  DIET: Good nutrition starts with a healthy diet of fruits, vegetables, whole grains, and lean protein sources. Drink plenty of water for hydration. Minimize empty calories, sodium, sweets. For more information about dietary recommendations visit: https://health.gov/our-work/nutrition-physical-activity/dietary-guidelines and https://www.myplate.gov/  ALCOHOL:  Women should limit their alcohol intake to no more than 7 drinks/beers/glasses of wine (combined, not each!) per week. Moderation of alcohol intake to this level decreases your risk of breast cancer and liver damage.  If you are concerned that you may have a problem, or your friends have told you they are concerned about your drinking, there are many resources to help. A well-known program that is free, effective, and available to all people all over the nation is Alcoholics Anonymous.  Check out this site to learn more: https://www.aa.org/   CALCIUM AND VITAMIN D:  Adequate intake of calcium and Vitamin D are recommended for bone health.  You should be getting between 1000-1200 mg of calcium and 800 units of Vitamin D daily between diet and supplements  PAP SMEARS:  Pap smears, to check for cervical cancer or precancers,  have traditionally been  done yearly, scientific advances have shown that most women can have pap smears less often.  However, every woman still should have a physical exam from her gynecologist every year. It will include a breast check, inspection of the vulva and vagina to check for abnormal growths or skin changes, a visual exam of the cervix, and then an exam to evaluate the size and shape of the uterus and ovaries. We will also provide age appropriate advice regarding health maintenance, like when you should have certain vaccines, screening for sexually transmitted diseases, bone density testing, colonoscopy, mammograms, etc.   MAMMOGRAMS:  All women over 40 years old should have a routine mammogram.   COLON CANCER SCREENING: Now recommend starting at age 45. At this time colonoscopy is not covered for routine screening until 50. There are take home tests that can be done between 45-49.   COLONOSCOPY:  Colonoscopy to screen for colon cancer is recommended for all women at age 50.  We know, you hate the idea of the prep.  We agree, BUT, having colon cancer and not knowing it is worse!!  Colon cancer so often starts as a polyp that can be seen and removed at colonscopy, which can quite literally save your life!  And if your first colonoscopy is normal and you have no family history of colon cancer, most women don't have to have it again for 10 years.  Once every ten years, you can do something that may end up saving your life, right?  We will be happy to help you get it scheduled when you are ready.  Be sure to check your insurance coverage so you understand how much it will cost.  It may be covered as a preventative service at no cost, but you should check   your particular policy.      Breast Self-Awareness Breast self-awareness means being familiar with how your breasts look and feel. It involves checking your breasts regularly and reporting any changes to your health care provider. Practicing breast self-awareness is  important. A change in your breasts can be a sign of a serious medical problem. Being familiar with how your breasts look and feel allows you to find any problems early, when treatment is more likely to be successful. All women should practice breast self-awareness, including women who have had breast implants. How to do a breast self-exam One way to learn what is normal for your breasts and whether your breasts are changing is to do a breast self-exam. To do a breast self-exam: Look for Changes  1. Remove all the clothing above your waist. 2. Stand in front of a mirror in a room with good lighting. 3. Put your hands on your hips. 4. Push your hands firmly downward. 5. Compare your breasts in the mirror. Look for differences between them (asymmetry), such as: ? Differences in shape. ? Differences in size. ? Puckers, dips, and bumps in one breast and not the other. 6. Look at each breast for changes in your skin, such as: ? Redness. ? Scaly areas. 7. Look for changes in your nipples, such as: ? Discharge. ? Bleeding. ? Dimpling. ? Redness. ? A change in position. Feel for Changes Carefully feel your breasts for lumps and changes. It is best to do this while lying on your back on the floor and again while sitting or standing in the shower or tub with soapy water on your skin. Feel each breast in the following way:  Place the arm on the side of the breast you are examining above your head.  Feel your breast with the other hand.  Start in the nipple area and make  inch (2 cm) overlapping circles to feel your breast. Use the pads of your three middle fingers to do this. Apply light pressure, then medium pressure, then firm pressure. The light pressure will allow you to feel the tissue closest to the skin. The medium pressure will allow you to feel the tissue that is a little deeper. The firm pressure will allow you to feel the tissue close to the ribs.  Continue the overlapping circles,  moving downward over the breast until you feel your ribs below your breast.  Move one finger-width toward the center of the body. Continue to use the  inch (2 cm) overlapping circles to feel your breast as you move slowly up toward your collarbone.  Continue the up and down exam using all three pressures until you reach your armpit.  Write Down What You Find  Write down what is normal for each breast and any changes that you find. Keep a written record with breast changes or normal findings for each breast. By writing this information down, you do not need to depend only on memory for size, tenderness, or location. Write down where you are in your menstrual cycle, if you are still menstruating. If you are having trouble noticing differences in your breasts, do not get discouraged. With time you will become more familiar with the variations in your breasts and more comfortable with the exam. How often should I examine my breasts? Examine your breasts every month. If you are breastfeeding, the best time to examine your breasts is after a feeding or after using a breast pump. If you menstruate, the best time to   examine your breasts is 5-7 days after your period is over. During your period, your breasts are lumpier, and it may be more difficult to notice changes. When should I see my health care provider? See your health care provider if you notice:  A change in shape or size of your breasts or nipples.  A change in the skin of your breast or nipples, such as a reddened or scaly area.  Unusual discharge from your nipples.  A lump or thick area that was not there before.  Pain in your breasts.  Anything that concerns you.  Atrophic Vaginitis  Atrophic vaginitis is a condition in which the tissues that line the vagina become dry and thin. This condition is most common in women who have stopped having regular menstrual periods (are in menopause). This usually starts when a woman is 67 to 50  years old. That is the time when a woman's estrogen levels begin to decrease. Estrogen is a female hormone. It helps to keep the tissues of the vagina moist. It stimulates the vagina to produce a clear fluid that lubricates the vagina for sex. This fluid also protects the vagina from infection. Lack of estrogen can cause the lining of the vagina to get thinner and dryer. The vagina may also shrink in size. It may become less elastic. Atrophic vaginitis tends to get worse over time as a woman's estrogen level drops. What are the causes? This condition is caused by the normal drop in estrogen that happens around the time of menopause. What increases the risk? Certain conditions or situations may lower a woman's estrogen level, leading to a higher risk for atrophic vaginitis. You are more likely to develop this condition if: You are taking medicines that block estrogen. You have had your ovaries removed. You are being treated for cancer with radiation or medicines (chemotherapy). You have given birth or are breastfeeding. You are older than age 61. You smoke. What are the signs or symptoms? Symptoms of this condition include: Pain, soreness, a feeling of pressure, or bleeding during sex (dyspareunia). Vaginal burning, irritation, or itching. Pain or bleeding when a speculum is used in a vaginal exam. Having burning pain while urinating. Vaginal discharge. In some cases, there are no symptoms. How is this diagnosed? This condition is diagnosed based on your medical history and a physical exam. This will include a pelvic exam that checks the vaginal tissues. Though rare, you may also have other tests, including: A urine test. A test that checks the acid balance in your vagina (acid balance test). How is this treated? Treatment for this condition depends on how severe your symptoms are. Treatment may include: Using an over-the-counter vaginal lubricant before sex. Using a long-acting vaginal  moisturizer. Using low-dose estrogen for moderate to severe symptoms that do not respond to other treatments. Options include creams, tablets, and inserts (vaginal rings). Before you use a vaginal estrogen, tell your health care provider if you have a history of: Breast cancer. Endometrial cancer. Blood clots. If you are not sexually active and your symptoms are very mild, you may not need treatment. Follow these instructions at home: Medicines Take over-the-counter and prescription medicines only as told by your health care provider. Do not use herbal or alternative medicines unless your health care provider says that you can. Use over-the-counter creams, lubricants, or moisturizers for dryness only as told by your health care provider. General instructions If your atrophic vaginitis is caused by menopause, discuss all of your menopause symptoms  and treatment options with your health care provider. Do not douche. Do not use products that can make your vagina dry. These include: Scented feminine sprays. Scented tampons. Scented soaps. Vaginal sex can help to improve blood flow and elasticity of vaginal tissue. If you choose to have sex and it hurts, try using a water-soluble lubricant or moisturizer right before having sex. Contact a health care provider if: Your discharge looks different than normal. Your vagina has an unusual smell. You have new symptoms. Your symptoms do not improve with treatment. Your symptoms get worse. Summary Atrophic vaginitis is a condition in which the tissues that line the vagina become dry and thin. It is most common in women who have stopped having regular menstrual periods (are in menopause). Treatment options include using vaginal lubricants and low-dose vaginal estrogen. Contact a health care provider if your vagina has an unusual smell, or if your symptoms get worse or do not improve after treatment. This information is not intended to replace advice  given to you by your health care provider. Make sure you discuss any questions you have with your health care provider. Document Revised: 07/21/2019 Document Reviewed: 07/21/2019 Elsevier Patient Education  2021 [employer]  Atrophic Vaginitis  Atrophic vaginitis is a condition in which the tissues that line the vagina become dry and thin. This condition is most common in women who have stopped having regular menstrual periods (are in menopause). This usually starts when a woman is 64 to 50 years old. That is the time when a woman's estrogen levels begin to decrease. Estrogen is a female hormone. It helps to keep the tissues of the vagina moist. It stimulates the vagina to produce a clear fluid that lubricates the vagina for sex. This fluid also protects the vagina from infection. Lack of estrogen can cause the lining of the vagina to get thinner and dryer. The vagina may also shrink in size. It may become less elastic. Atrophic vaginitis tends to get worse over time as a woman's estrogen level drops. What are the causes? This condition is caused by the normal drop in estrogen that happens around the time of menopause. What increases the risk? Certain conditions or situations may lower a woman's estrogen level, leading to a higher risk for atrophic vaginitis. You are more likely to develop this condition if:  You are taking medicines that block estrogen.  You have had your ovaries removed.  You are being treated for cancer with radiation or medicines (chemotherapy).  You have given birth or are breastfeeding.  You are older than age 71.  You smoke. What are the signs or symptoms? Symptoms of this condition include:  Pain, soreness, a feeling of pressure, or bleeding during sex (dyspareunia).  Vaginal burning, irritation, or itching.  Pain or bleeding when a speculum is used in a vaginal exam.  Having burning pain while urinating.  Vaginal discharge. In some cases, there are no  symptoms. How is this diagnosed? This condition is diagnosed based on your medical history and a physical exam. This will include a pelvic exam that checks the vaginal tissues. Though rare, you may also have other tests, including:  A urine test.  A test that checks the acid balance in your vagina (acid balance test). How is this treated? Treatment for this condition depends on how severe your symptoms are. Treatment may include:  Using an over-the-counter vaginal lubricant before sex.  Using a long-acting vaginal moisturizer.  Using low-dose estrogen for moderate to severe symptoms  that do not respond to other treatments. Options include creams, tablets, and inserts (vaginal rings). Before you use a vaginal estrogen, tell your health care provider if you have a history of: ? Breast cancer. ? Endometrial cancer. ? Blood clots. If you are not sexually active and your symptoms are very mild, you may not need treatment. Follow these instructions at home: Medicines  Take over-the-counter and prescription medicines only as told by your health care provider.  Do not use herbal or alternative medicines unless your health care provider says that you can.  Use over-the-counter creams, lubricants, or moisturizers for dryness only as told by your health care provider. General instructions  If your atrophic vaginitis is caused by menopause, discuss all of your menopause symptoms and treatment options with your health care provider.  Do not douche.  Do not use products that can make your vagina dry. These include: ? Scented feminine sprays. ? Scented tampons. ? Scented soaps.  Vaginal sex can help to improve blood flow and elasticity of vaginal tissue. If you choose to have sex and it hurts, try using a water-soluble lubricant or moisturizer right before having sex. Contact a health care provider if:  Your discharge looks different than normal.  Your vagina has an unusual smell.  You  have new symptoms.  Your symptoms do not improve with treatment.  Your symptoms get worse. Summary  Atrophic vaginitis is a condition in which the tissues that line the vagina become dry and thin. It is most common in women who have stopped having regular menstrual periods (are in menopause).  Treatment options include using vaginal lubricants and low-dose vaginal estrogen.  Contact a health care provider if your vagina has an unusual smell, or if your symptoms get worse or do not improve after treatment. This information is not intended to replace advice given to you by your health care provider. Make sure you discuss any questions you have with your health care provider. Document Revised: 07/21/2019 Document Reviewed: 07/21/2019 Elsevier Patient Education  2021 [employer] Inc.  [employer] ([phone] ed., pp. [phone]). [place], [place]: Elsevier.">  Perimenopause Perimenopause is the normal time of a woman's life when the levels of estrogen, the female hormone produced by the ovaries, begin to decrease. This leads to changes in menstrual periods before they stop completely (menopause). Perimenopause can begin 2-8 years before menopause. During perimenopause, the ovaries may or may not produce an egg and a woman can still become pregnant. What are the causes? This condition is caused by a natural change in hormone levels that happens as you get older. What increases the risk? This condition is more likely to start at an earlier age if you have certain medical conditions or have undergone treatments, including:  A tumor of the pituitary gland in the brain.  A disease that affects the ovaries and hormone production.  Certain cancer treatments, such as chemotherapy or hormone therapy, or radiation therapy on the pelvis.  Heavy smoking and excessive alcohol use.  Family history of early menopause. What are the signs or symptoms? Perimenopausal changes affect each woman  differently. Symptoms of this condition may include:  Hot flashes.  Irregular menstrual periods.  Night sweats.  Changes in feelings about sex. This could be a decrease in sex drive or an increased discomfort around your sexuality.  Vaginal dryness.  Headaches.  Mood swings.  Depression.  Problems sleeping (insomnia).  Memory problems or trouble concentrating.  Irritability.  Tiredness.  Weight gain.  Anxiety.  Trouble getting pregnant. How is this diagnosed? This condition is diagnosed based on your medical history, a physical exam, your age, your menstrual history, and your symptoms. Hormone tests may also be done. How is this treated? In some cases, no treatment is needed. You and your health care provider should make a decision together about whether treatment is necessary. Treatment will be based on your individual condition and preferences. Various treatments are available, such as:  Menopausal hormone therapy (MHT).  Medicines to treat specific symptoms.  Acupuncture.  Vitamin or herbal supplements. Before starting treatment, make sure to let your health care provider know if you have a personal or family history of:  Heart disease.  Breast cancer.  Blood clots.  Diabetes.  Osteoporosis. Follow these instructions at home: Medicines  Take over-the-counter and prescription medicines only as told by your health care provider.  Take vitamin supplements only as told by your health care provider.  Talk with your health care provider before starting any herbal supplements. Lifestyle  Do not use any products that contain nicotine or tobacco, such as cigarettes, e-cigarettes, and chewing tobacco. If you need help quitting, ask your health care provider.  Get at least 30 minutes of physical activity on 5 or more days each week.  Eat a balanced diet that includes fresh fruits and vegetables, whole grains, soybeans, eggs, lean meat, and low-fat  dairy.  Avoid alcoholic and caffeinated beverages, as well as spicy foods. This may help prevent hot flashes.  Get 7-8 hours of sleep each night.  Dress in layers that can be removed to help you manage hot flashes.  Find ways to manage stress, such as deep breathing, meditation, or journaling.   General instructions  Keep track of your menstrual periods, including: ? When they occur. ? How heavy they are and how long they last. ? How much time passes between periods.  Keep track of your symptoms, noting when they start, how often you have them, and how long they last.  Use vaginal lubricants or moisturizers to help with vaginal dryness and improve comfort during sex.  You can still become pregnant if you are having irregular periods. Make sure you use contraception during perimenopause if you do not want to get pregnant.  Keep all follow-up visits. This is important. This includes any group therapy or counseling.   Contact a health care provider if:  You have heavy vaginal bleeding or pass blood clots.  Your period lasts more than 2 days longer than normal.  Your periods are recurring sooner than 21 days.  You bleed after having sex.  You have pain during sex. Get help right away if you have:  Chest pain, trouble breathing, or trouble talking.  Severe depression.  Pain when you urinate.  Severe headaches.  Vision problems. Summary  Perimenopause is the time when a woman's body begins to move into menopause. This may happen naturally or as a result of other health problems or medical treatments.  Perimenopause can begin 2-8 years before menopause, and it can last for several years.  Perimenopausal symptoms can be managed through medicines, lifestyle changes, and complementary therapies such as acupuncture. This information is not intended to replace advice given to you by your health care provider. Make sure you discuss any questions you have with your health care  provider. Document Revised: 07/07/2019 Document Reviewed: 07/07/2019 Elsevier Patient Education  2021 [employer].

## 2020-03-08 NOTE — Progress Notes (Signed)
50 y.o. [id number] Married White or Caucasian Not Hispanic or Latino female here for annual exam. patient states that she has a vaginal discharge with some itching.  She has a paragard IUD, placed in 11/16.  In the last 6 months she has had 2 cycles. She has taken provera, the last time was the first week of January and no bleeding.  She is having vasomotor symptoms, having about 3 hot flashes a day. ~2 night sweats a night. Overall tolerable.   She is c/o vaginal dryness, hasn't tried lubrication. Discussed options.   Rare HSV outbreaks.     Patient's last menstrual period was 12/11/2019.          Sexually active: Yes.    The current method of family planning is IUD.    Exercising: Yes.    spin class and yoga Smoker:  no  Health Maintenance: Pap:  01/21/18 Neg:Neg HR HPV History of abnormal Pap:  no MMG:  11/04/19 BIRADS 1 negative/density c BMD:   n/a Colonoscopy: n/a TDaP:  12/07/14 Gardasil: n/a   reports that she has never smoked. She has never used smokeless tobacco. She reports current alcohol use. She reports that she does not use drugs. 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  Past Medical History:  Diagnosis Date  . Chronic low back pain   . History of kidney stones    at age 53  . Sinusitis   . STD (sexually transmitted disease)    hx of HSVII since age 70    History reviewed. No pertinent surgical history.  Current Outpatient Medications  Medication Sig Dispense Refill  . loratadine (CLARITIN) 10 MG tablet Take 10 mg by mouth daily as needed for allergies.    Marland Kitchen PARAGARD INTRAUTERINE COPPER IU by Intrauterine route.    . valACYclovir (VALTREX) 500 MG tablet 1 tab po bid x 3 days prn 30 tablet 1  . Ibuprofen-Famotidine 800-26.6 MG TABS Take 1 tablet by mouth 3 (three) times daily. (Patient not taking: Reported on 03/08/2020) 90 tablet 3  . medroxyPROGESTERone (PROVERA) 5 MG tablet Take one tablet daily x 5 days now and every month if no spontaneous menses.  (Patient not taking: No sig reported) 15 tablet 3   No current facility-administered medications for this visit.    Family History  Problem Relation Age of Onset  . Heart disease Father   . Other Father        Dec MVA due to poss heart attack at age 74  . Diabetes Father        AODM  . Hypertension Father   . Heart attack Father   . Heart attack Sister 19  . Hypertension Mother   . Heart disease Sister   . Stroke Maternal Grandmother   . Brain cancer Paternal Uncle   . Brain cancer Paternal Grandfather     Review of Systems  Constitutional: Negative.   HENT: Negative.   Eyes: Negative.   Respiratory: Negative.   Cardiovascular: Negative.   Gastrointestinal: Negative.   Endocrine: Negative.   Genitourinary: Positive for vaginal discharge.  Musculoskeletal: Negative.   Skin: Negative.   Allergic/Immunologic: Negative.   Neurological: Negative.   Hematological: Negative.   Psychiatric/Behavioral: Negative.     Exam:   BP 118/70 (BP Location: Left Arm, Patient Position: Sitting, Cuff Size: Normal)   Pulse 72   Ht 5\' 5"  (1.651 m)   Wt 172 lb (78 kg)   LMP 12/11/2019   BMI 28.62 kg/m  Weight change: @WEIGHTCHANGE @ Height:   Height: 5\' 5"  (165.1 cm)  Ht Readings from Last 3 Encounters:  03/08/20 5\' 5"  (1.651 m)  05/05/19 5\' 6"  (1.676 m)  04/28/19 5\' 6"  (1.676 m)    General appearance: alert, cooperative and appears stated age Head: Normocephalic, without obvious abnormality, atraumatic Neck: no adenopathy, supple, symmetrical, trachea midline and thyroid normal to inspection and palpation Lungs: clear to auscultation bilaterally Cardiovascular: regular rate and rhythm Breasts: normal appearance, no masses or tenderness Abdomen: soft, non-tender; non distended,  no masses,  no organomegaly Extremities: extremities normal, atraumatic, no cyanosis or edema Skin: Skin color, texture, turgor normal. No rashes or lesions Lymph nodes: Cervical, supraclavicular, and  axillary nodes normal. No abnormal inguinal nodes palpated Neurologic: Grossly normal   Pelvic: External genitalia:  no lesions              Urethra:  normal appearing urethra with no masses, tenderness or lesions              Bartholins and Skenes: normal                 Vagina: normal appearing vagina with normal color and discharge, no lesions              Cervix: no lesions and IUD string 4 cm               Bimanual Exam:  Uterus:  normal size, contour, position, consistency, mobility, non-tender              Adnexa: no mass, fullness, tenderness               Rectovaginal: Confirms               Anus:  normal sphincter tone, no lesions    1. Well woman exam Discussed breast self exam Discussed calcium and vit D intake No pap this year Mammogram UTD  2. IUD check up Doing well  3. Perimenopause Information given  4. Secondary oligomenorrhea C/w perimenopause.  - medroxyPROGESTERone (PROVERA) 5 MG tablet; Take one tablet daily x 5 days now and every month if no spontaneous menses.  Dispense: 15 tablet; Refill: 3  5. Herpes simplex infection of genitourinary system Rare outbreaks - valACYclovir (VALTREX) 500 MG tablet; 1 tab po bid x 3 days prn  Dispense: 30 tablet; Refill: 1  6. Laboratory exam ordered as part of routine general medical examination  - CBC - Comprehensive metabolic panel - Lipid panel  7. Family history of diabetes mellitus (DM)  - Hemoglobin A1c  8. Vaginal atrophy Try lubrication, call if not helping

## 2020-03-09 ENCOUNTER — Other Ambulatory Visit: Payer: Self-pay | Admitting: Obstetrics and Gynecology

## 2020-03-09 DIAGNOSIS — R7989 Other specified abnormal findings of blood chemistry: Secondary | ICD-10-CM

## 2020-03-09 LAB — HEMOGLOBIN A1C
Hgb A1c MFr Bld: 5.3 % of total Hgb (ref <5.7)
Mean Plasma Glucose: 105 mg/dL
eAG (mmol/L): 5.8 mmol/L

## 2020-03-09 LAB — CBC
HCT: 41.4 % (ref 35.0–45.0)
Hemoglobin: 14.4 g/dL (ref 11.7–15.5)
MCH: 32.5 pg (ref 27.0–33.0)
MCHC: 34.8 g/dL (ref 32.0–36.0)
MCV: 93.5 fL (ref 80.0–100.0)
MPV: 11.8 fL (ref 7.5–12.5)
Platelets: 205 10*3/uL (ref 140–400)
RBC: 4.43 10*6/uL (ref 3.80–5.10)
RDW: 11.7 % (ref 11.0–15.0)
WBC: 5.8 10*3/uL (ref 3.8–10.8)

## 2020-03-09 LAB — COMPREHENSIVE METABOLIC PANEL
AG Ratio: 1.8 (calc) (ref 1.0–2.5)
ALT: 53 U/L — ABNORMAL HIGH (ref 6–29)
AST: 55 U/L — ABNORMAL HIGH (ref 10–35)
Albumin: 4.6 g/dL (ref 3.6–5.1)
Alkaline phosphatase (APISO): 102 U/L (ref 31–125)
BUN: 19 mg/dL (ref 7–25)
CO2: 27 mmol/L (ref 20–32)
Calcium: 9.8 mg/dL (ref 8.6–10.2)
Chloride: 103 mmol/L (ref 98–110)
Creat: 0.76 mg/dL (ref 0.50–1.10)
Globulin: 2.5 g/dL (calc) (ref 1.9–3.7)
Glucose, Bld: 90 mg/dL (ref 65–99)
Potassium: 4.4 mmol/L (ref 3.5–5.3)
Sodium: 140 mmol/L (ref 135–146)
Total Bilirubin: 0.8 mg/dL (ref 0.2–1.2)
Total Protein: 7.1 g/dL (ref 6.1–8.1)

## 2020-03-09 LAB — LIPID PANEL
Cholesterol: 202 mg/dL — ABNORMAL HIGH (ref <200)
HDL: 83 mg/dL (ref >=50)
LDL Cholesterol (Calc): 99 mg/dL (calc)
Non-HDL Cholesterol (Calc): 119 mg/dL (calc) (ref <130)
Total CHOL/HDL Ratio: 2.4 (calc) (ref <5.0)
Triglycerides: 100 mg/dL (ref <150)

## 2020-03-12 ENCOUNTER — Telehealth: Payer: Self-pay | Admitting: *Deleted

## 2020-03-12 ENCOUNTER — Telehealth: Payer: Self-pay

## 2020-03-12 DIAGNOSIS — R7989 Other specified abnormal findings of blood chemistry: Secondary | ICD-10-CM

## 2020-03-12 NOTE — Telephone Encounter (Signed)
Patient sent e-mail after I had spoken with her about result note with elevated LFT's. "After my conversation with Lynden Ang, I wanted to share that since July, I have been consistently taking Alaya Multi Collegen and up until about 3 weeks ago, I would take Duexis about 4 times a week for shoulder pain. Thankfully I have about 98% of my shoulder mobility back from having adhesive capsulitus and have not needed to take it anymore. Pictures of both containers attached. I am not sure if either of these have contributed to my liver levels or not."

## 2020-03-12 NOTE — Telephone Encounter (Signed)
Order placed at [facility] for complete ultrasound, my chart message sent informing patient she can call to schedule.

## 2020-03-12 NOTE — Telephone Encounter (Signed)
-----   Message from Keenan Bachelor, [place] sent at 03/12/2020 11:42 AM EST ----- Regarding: u/s at imaging center per [facility] Please let the patient know that her LFT's are mildly elevated. I've order more blood work for her, please set a lab appointment. She also needs a RUQ ultrasound, please schedule.

## 2020-03-13 ENCOUNTER — Other Ambulatory Visit: Payer: Self-pay

## 2020-03-13 ENCOUNTER — Other Ambulatory Visit (INDEPENDENT_AMBULATORY_CARE_PROVIDER_SITE_OTHER): Payer: 59

## 2020-03-13 DIAGNOSIS — R7989 Other specified abnormal findings of blood chemistry: Secondary | ICD-10-CM

## 2020-03-13 NOTE — Telephone Encounter (Signed)
Ultrasound scheduled on 03/28/20

## 2020-03-14 LAB — IRON,TIBC AND FERRITIN PANEL
%SAT: 30 % (calc) (ref 16–45)
Ferritin: 95 ng/mL (ref 16–232)
Iron: 101 ug/dL (ref 40–190)
TIBC: 341 mcg/dL (calc) (ref 250–450)

## 2020-03-14 LAB — HEPATITIS C ANTIBODY
Hepatitis C Ab: NONREACTIVE
SIGNAL TO CUT-OFF: 0.01 (ref <1.00)

## 2020-03-14 LAB — HEPATITIS B CORE ANTIBODY, IGM: Hep B C IgM: NONREACTIVE

## 2020-03-14 LAB — HEPATITIS B SURFACE ANTIGEN: Hepatitis B Surface Ag: NONREACTIVE

## 2020-03-14 LAB — HEPATITIS B SURFACE ANTIBODY,QUALITATIVE: Hep B S Ab: NONREACTIVE

## 2020-03-14 NOTE — Telephone Encounter (Signed)
Result note was sent answering her questions.

## 2020-03-15 NOTE — Telephone Encounter (Signed)
Dr.Jertson sent a message stating " I really just want to see her liver and gallbladder." I called [facility] and was told to put in the order for [id number] for only liver and gallbladder. New order placed.

## 2020-03-15 NOTE — Addendum Note (Signed)
Addended by: Aura Camps on: 03/15/2020 11:43 AM   Modules accepted: Orders

## 2020-03-28 ENCOUNTER — Other Ambulatory Visit: Payer: 59

## 2020-03-28 ENCOUNTER — Ambulatory Visit
Admission: RE | Admit: 2020-03-28 | Discharge: 2020-03-28 | Disposition: A | Discharge status: Home / Self Care | Payer: 59 | Source: Ambulatory Visit | Attending: Obstetrics and Gynecology | Admitting: Obstetrics and Gynecology

## 2020-03-28 ENCOUNTER — Other Ambulatory Visit: Payer: Self-pay

## 2020-03-28 DIAGNOSIS — R7989 Other specified abnormal findings of blood chemistry: Secondary | ICD-10-CM

## 2020-04-02 ENCOUNTER — Encounter: Payer: Self-pay | Admitting: Obstetrics and Gynecology

## 2020-04-02 ENCOUNTER — Other Ambulatory Visit: Payer: Self-pay | Admitting: Obstetrics and Gynecology

## 2020-04-02 DIAGNOSIS — K760 Fatty (change of) liver, not elsewhere classified: Secondary | ICD-10-CM | POA: Insufficient documentation

## 2020-04-02 HISTORY — DX: Fatty (change of) liver, not elsewhere classified: K76.0

## 2020-06-12 ENCOUNTER — Encounter: Payer: Self-pay | Admitting: Family Medicine

## 2020-06-26 ENCOUNTER — Other Ambulatory Visit: Payer: 59

## 2020-07-03 ENCOUNTER — Other Ambulatory Visit: Payer: 59

## 2020-07-03 ENCOUNTER — Other Ambulatory Visit: Payer: Self-pay

## 2020-07-03 DIAGNOSIS — K760 Fatty (change of) liver, not elsewhere classified: Secondary | ICD-10-CM

## 2020-07-04 ENCOUNTER — Encounter: Payer: Self-pay | Admitting: Obstetrics and Gynecology

## 2020-07-04 LAB — HEPATIC FUNCTION PANEL
AG Ratio: 2 (calc) (ref 1.0–2.5)
ALT: 18 U/L (ref 6–29)
AST: 24 U/L (ref 10–35)
Albumin: 4.5 g/dL (ref 3.6–5.1)
Alkaline phosphatase (APISO): 95 U/L (ref 31–125)
Bilirubin, Direct: 0.1 mg/dL (ref 0.0–0.2)
Globulin: 2.2 g/dL (calc) (ref 1.9–3.7)
Indirect Bilirubin: 0.5 mg/dL (calc) (ref 0.2–1.2)
Total Bilirubin: 0.6 mg/dL (ref 0.2–1.2)
Total Protein: 6.7 g/dL (ref 6.1–8.1)

## 2022-05-19 ENCOUNTER — Encounter: Payer: Self-pay | Admitting: *Deleted

## 2022-09-15 NOTE — Telephone Encounter (Signed)
Pcp removed
